# Patient Record
Sex: Female | Born: 1970 | Race: Asian | Hispanic: No | Marital: Married | Smoking: Never smoker
Health system: Southern US, Community
[De-identification: ages and names within clinical notes are randomized; demographics above are authoritative.]

## PROBLEM LIST (undated history)

## (undated) DIAGNOSIS — R0789 Other chest pain: Secondary | ICD-10-CM

## (undated) DIAGNOSIS — J45909 Unspecified asthma, uncomplicated: Secondary | ICD-10-CM

## (undated) DIAGNOSIS — Z9289 Personal history of other medical treatment: Secondary | ICD-10-CM

## (undated) HISTORY — PX: DILATION AND EVACUATION: SHX1459

---

## 1998-10-22 ENCOUNTER — Other Ambulatory Visit: Admission: RE | Admit: 1998-10-22 | Discharge: 1998-10-22 | Payer: Self-pay | Admitting: Obstetrics and Gynecology

## 1999-01-14 ENCOUNTER — Ambulatory Visit (HOSPITAL_COMMUNITY): Admission: RE | Admit: 1999-01-14 | Discharge: 1999-01-14 | Payer: Self-pay | Admitting: Obstetrics and Gynecology

## 1999-01-14 ENCOUNTER — Encounter: Payer: Self-pay | Admitting: Obstetrics and Gynecology

## 2000-03-11 ENCOUNTER — Other Ambulatory Visit: Admission: RE | Admit: 2000-03-11 | Discharge: 2000-03-11 | Payer: Self-pay | Admitting: Obstetrics and Gynecology

## 2000-06-08 ENCOUNTER — Ambulatory Visit (HOSPITAL_COMMUNITY): Admission: RE | Admit: 2000-06-08 | Discharge: 2000-06-08 | Payer: Self-pay | Admitting: Gynecology

## 2000-06-08 ENCOUNTER — Encounter: Payer: Self-pay | Admitting: Gynecology

## 2001-04-13 ENCOUNTER — Other Ambulatory Visit: Admission: RE | Admit: 2001-04-13 | Discharge: 2001-04-13 | Payer: Self-pay | Admitting: Obstetrics and Gynecology

## 2002-07-10 ENCOUNTER — Other Ambulatory Visit: Admission: RE | Admit: 2002-07-10 | Discharge: 2002-07-10 | Payer: Self-pay | Admitting: Obstetrics and Gynecology

## 2002-12-15 ENCOUNTER — Ambulatory Visit (HOSPITAL_COMMUNITY): Admission: RE | Admit: 2002-12-15 | Discharge: 2002-12-15 | Payer: Self-pay | Admitting: Obstetrics and Gynecology

## 2002-12-15 ENCOUNTER — Encounter (INDEPENDENT_AMBULATORY_CARE_PROVIDER_SITE_OTHER): Payer: Self-pay

## 2003-07-03 ENCOUNTER — Ambulatory Visit (HOSPITAL_COMMUNITY): Admission: RE | Admit: 2003-07-03 | Discharge: 2003-07-03 | Payer: Self-pay | Admitting: Obstetrics and Gynecology

## 2003-12-18 ENCOUNTER — Inpatient Hospital Stay (HOSPITAL_COMMUNITY): Admission: RE | Admit: 2003-12-18 | Discharge: 2003-12-22 | Payer: Self-pay | Admitting: Obstetrics and Gynecology

## 2004-01-22 ENCOUNTER — Other Ambulatory Visit: Admission: RE | Admit: 2004-01-22 | Discharge: 2004-01-22 | Payer: Self-pay | Admitting: Obstetrics and Gynecology

## 2004-12-02 ENCOUNTER — Other Ambulatory Visit: Admission: RE | Admit: 2004-12-02 | Discharge: 2004-12-02 | Payer: Self-pay | Admitting: Obstetrics and Gynecology

## 2005-03-25 ENCOUNTER — Ambulatory Visit (HOSPITAL_COMMUNITY): Admission: RE | Admit: 2005-03-25 | Discharge: 2005-03-25 | Payer: Self-pay | Admitting: Obstetrics and Gynecology

## 2006-07-26 ENCOUNTER — Emergency Department (HOSPITAL_COMMUNITY): Admission: EM | Admit: 2006-07-26 | Discharge: 2006-07-26 | Payer: Self-pay | Admitting: Emergency Medicine

## 2006-09-17 ENCOUNTER — Inpatient Hospital Stay (HOSPITAL_COMMUNITY): Admission: RE | Admit: 2006-09-17 | Discharge: 2006-09-20 | Payer: Self-pay | Admitting: Obstetrics and Gynecology

## 2009-05-22 ENCOUNTER — Encounter: Admission: RE | Admit: 2009-05-22 | Discharge: 2009-05-22 | Payer: Self-pay

## 2011-01-09 NOTE — Op Note (Signed)
[name], BATDORF                  ACCOUNT NO.:  [id number]   MEDICAL RECORD NO.:  [id number]          PATIENT TYPE:  INP   LOCATION:  9170                          FACILITY:  [facility]   PHYSICIAN:  Malva Limes, M.D.    DATE OF BIRTH:  [birth year]   DATE OF PROCEDURE:  09/17/2006  DATE OF DISCHARGE:                               OPERATIVE REPORT   PREOPERATIVE DIAGNOSES:  1. Intrauterine pregnancy at term.  2. History of prior cesarean section.  3. Patient desires attempt at cesarean section.  4. Compound presentation.   POSTOPERATIVE DIAGNOSES:  1. Intrauterine pregnancy at term.  2. History of prior cesarean section.  3. Patient desires attempt at cesarean section.  4. Compound presentation.   PROCEDURE:  Repeat low transverse cesarean section.   SURGEON:  Malva Limes, M.D.   ANESTHESIA:  Epidural.   ANTIBIOTICS:  Ancef 1 gram.   DRAINS:  Foley to bedside drainage.   ESTIMATED BLOOD LOSS:  900 cc.   COMPLICATIONS:  None.   SPECIMENS:  None.   FINDINGS:  The patient delivered one live viable female infant, with  Apgars of 9 at one minute and 10 at 5 minutes.   PROCEDURE:  The patient was taken to the operating room, where she was  placed in the dorsal supine position with a left lateral tilt.  Once an  adequate level was reached, the patient was prepped with Betadine and  draped in the usual fashion for this procedure.  The patient had a  Pfannenstiel incision made through the previous scar; this was carried  down through fascia.  The fascia was entered in the midline and extended  laterally with the Mayo scissors.  The rectus muscles were then  dissected from the fascia with the Bovie.  The parietoperitoneum was  entered sharply and taken __________ .  A bladder flap was taken down  sharply.  A low transverse uterine incision was made in the midline  extended laterally with blunt dissection.  Amniotic fluid was noted be  clear.  The infant was delivered in the  vertex presentation.  Once we  delivered the head, the oropharynx was bulb suctioned.  The remaining  infant was then delivered; the cord doubly clamped and cut, and the  infant handed to the awaiting NICU team.  Cord blood was then obtained.  The placenta was manually removed.  The uterus was closed in a single  layer of 0 Monocryl in a running locking fashion.  The bladder flap was  closed using 2-0 Monocryl in a running fashion.  The parietoperitoneum  and rectus muscles were reapproximated in the midline using 2-0 Monocryl  in a running fashion.  The fascia was closed using 0 Monocryl suture in  a running fashion.  Subcuticular tissue was made hemostatic with the  Bovie.  Stainless steel clips were used to close the skin.  The patient  tolerated the procedure well.  She was taken to the recovery room in  stable condition.  Instrument and lap counts were correct x2.  It should  be noted that the patient  had extensive scar tissue throughout all  layers, making it a difficult cesarean section.           ______________________________  Malva Limes, M.D.     MA/MEDQ  D:  09/17/2006  T:  09/17/2006  Job:  [id number]

## 2011-01-09 NOTE — Discharge Summary (Signed)
[name], NEASE                              ACCOUNT NO.:  [id number]   MEDICAL RECORD NO.:  [id number]                   PATIENT TYPE:  INP   LOCATION:  9120                                 [facility]ILITY[facility]  [facility]   PHYSICIAN:  Miguel Aschoff, M.D.                    DATE OF BIRTH:  04/29/1971   DATE OF ADMISSION:  12/18/2003  DATE OF DISCHARGE:  12/22/2003                                 DISCHARGE SUMMARY   FINAL DIAGNOSES:  1. Intrauterine pregnancy at term.  2. Breech presentation and the patient declined attempt at external version.   PROCEDURE:  Primary low transverse cesarean section.   SURGEON:  Dr. Malva Limes.   ASSISTANT:  Dr. Annamaria Helling.   COMPLICATIONS:  None.   This 40 year old G3 P0-0-2-0 presents at term for a primary cesarean section  secondary to breech presentation.  The patient's antepartum course has been  complicated by history of infertility.  She also had a history of a  myomectomy, but otherwise antepartum course was uncomplicated.  The patient  had a negative group B strep culture performed in the office.  She was  admitted at this time on December 18, 2003.  She was taken to the operating  room by Dr. Malva Limes where a primary low transverse cesarean section  was performed with the delivery of a 7-pound 12-ounce female infant with  Apgars of 9 and 9.  Delivery went without complications.  The patient's  postoperative course was benign without significant fevers.  The patient was  exhausted and therefore was kept in the hospital until postoperative day #4.  She was sent home on a regular diet, told to decrease activities, told to  continue prenatal vitamins, was given Tylox one to two q.4h. as needed for  pain, was told to follow up in the office in 4-6 weeks.   LABORATORY DATA ON DISCHARGE:  The patient had a hemoglobin of 10.2, white  blood cell count of 8.9.     Leilani Able, P.A.-C.                Miguel Aschoff, M.D.    MB/MEDQ  D:  01/07/2004  T:   01/07/2004  Job:  [id number]

## 2011-01-09 NOTE — Op Note (Signed)
NAMESTEFANIE, HODGENS                              ACCOUNT NO.:  [id number]   MEDICAL RECORD NO.:  [id number]                   PATIENT TYPE:  INP   LOCATION:  9120                                 [facility]   PHYSICIAN:  Malva Limes, M.D.                 DATE OF BIRTH:  [birth year]   DATE OF PROCEDURE:  12/18/2003  DATE OF DISCHARGE:                                 OPERATIVE REPORT   PREOPERATIVE DIAGNOSES:  1. Intrauterine pregnancy at term.  2. Breech presentation.  3. Patient declined attempt at external version.   POSTOPERATIVE DIAGNOSES:  1. Intrauterine pregnancy at term.  2. Breech presentation.  3. Patient declined attempt at external version.   PROCEDURE:  Primary low transverse cesarean section.   SURGEON:  Malva Limes, M.D.   ASSISTANT:  Gerrit Friends. Aldona Bar, M.D.   ANESTHESIA:  Spinal.   ANTIBIOTICS:  Ancef 1 g.   ESTIMATED BLOOD LOSS:  900 mL.   COMPLICATIONS:  None.   SPECIMENS:  None.   DRAINS:  Foley to bedside drainage.   FINDINGS:  The patient delivered one live viable female infant in the frank  breech presentation.  The infant appeared to be normal.  Placenta appeared  to be normal.  Fallopian tubes, ovaries and uterus all normal.  There was no  evidence of a uterine septum.   DESCRIPTION OF PROCEDURE:  The patient was taken to the operating room where  a spinal anesthetic was placed without difficulty.  She was placed in the  dorsal supine position with left lateral tilt.  The patient was prepped with  Hibiclens and a Foley catheter was placed.  She was draped in the usual  fashion for this procedure.  A Pfannenstiel incision was made.  This was  carried down to the fascia.  Fascia was entered in the midline and extended  laterally.  The rectus muscles were dissected from the fascia with Bovie.  Rectus muscles were divided in the midline and taken superiorly and  inferiorly.  The parietal peritoneum was entered sharply.  The bladder blade  was  taken down sharply.  A low transverse uterine incision was made in the  midline, extended laterally with blunt dissection.  Amniotic sac was entered  and the fluid noted to be clear.  The infant was delivered in the breech  presentation.  After the head was delivered, the oropharynx and nostrils  were bulb suctioned.  The cord was doubly clamped and cut and the infant  handed to the NICU team.  Cord blood was then obtained.  Placenta was  manually removed.  Uterine incision was closed in a single layer of 0  chromic in a running locking fashion.  The bladder flap was closed using 0  chromic suture in a running fashion.  Hemostasis was checked and found to be  adequate.  The parietal peritoneum and rectus muscles were  reapproximated at  the midline using 0 Monocryl in a running fashion.  Fascia was closed using  0 Monocryl suture in a running fashion.  The subcuticular tissue was made  hemostatic with a Bovie.  Stainless steel clips were used to close the skin.  The patient tolerated the procedure well and she was taken to the recovery  room in stable condition.  Instrument and lap counts correct x2.                                               Malva Limes, M.D.    MA/MEDQ  D:  12/18/2003  T:  12/18/2003  Job:  [id number]

## 2011-01-09 NOTE — Op Note (Signed)
[name], [name]                              ACCOUNT NO.:  [id number]   MEDICAL RECORD NO.:  [id number]                   PATIENT TYPE:  AMB   LOCATION:  SDC                                  [facility]   PHYSICIAN:  Dineen Kid. Rana Snare, M.D.                 DATE OF BIRTH:  1971-04-23   DATE OF PROCEDURE:  12/15/2002  DATE OF DISCHARGE:                                 OPERATIVE REPORT   PREOPERATIVE DIAGNOSIS:  Intrauterine pregnancy at 15-1/2 weeks with severe  fetal nonimmune hydrops.   POSTOPERATIVE DIAGNOSIS:  Intrauterine pregnancy at 15-1/2 weeks with severe  fetal nonimmune hydrops.   PROCEDURE:  Dilatation and evacuation by ultrasound guidance.   SURGEON:  Dineen Kid. Rana Snare, M.D.   ANESTHESIA:  General endotracheal.   INDICATIONS:  The patient is a 40 year old G2, P1 at 15-1/2 weeks with  severe fetal nonimmune hydrops confirmed in our office, also with maternal  fetal medicine consultation and level 2 ultrasound performed in two  different facilities.  Because of the very poor prognosis patient desires  D&E.  Risks and benefits discussed at length.  Informed consent was obtained  . Blood type was B+.  See history and physical for further details.   DESCRIPTION OF PROCEDURE:  After adequate analgesia the patient placed in  dorsal lithotomy position.  She is sterilely prepped and draped.  The  bladder was sterilely drained.  Graves speculum was placed.  The Dilapan was  easily removed.  Tenaculum was placed in the anterior lip of the cervix.  The cervix was easily dilated up to a number 51 Pratt dilator.  A 14 mm  suction curette was inserted and amniotic fluid and placental fragments were  easily removed.  Ultrasound was placed on the abdomen and fetal fragments  were grasped with graspers until no more fetal parts could be palpated or  visualized on the ultrasound.  This was followed by suction curettage  revealing a gritty surface throughout the endometrial cavity and  also  confirmed by ultrasound.  No residual parts seen by ultrasound.  At this  point the curette was removed.  The patient received Methergine 0.2 mg IM  with good uterine response.  Tenaculum was removed from the cervix and noted  to be hemostatic.  A curette was easily inserted and used to sound across  the uterine cavity, again revealing a gritty surface throughout the  endometrium and no residual parts.  At this time the curette was removed.  Cervix noted to be hemostatic.  Speculum was then removed.  Pelvic  examination after the procedure revealed approximately about 10 week sized  uterus with minimal bleeding.  The patient was transferred to the recovery  room in stable condition.  Sponge and instrument count was normal x3.  The  estimated blood loss during the procedure was 100 mL.   DISPOSITION:  The patient  to be discharged home.  Will follow up in the  office in two to three weeks.  Sent home with a prescription for Methergine  0.2 mg to take q.8h. for three days and doxycycline 100 mg p.o. b.i.d. x7  days.  Told to return for increased bleeding, cramping, or fever.                                               Dineen Kid Rana Snare, M.D.    DCL/MEDQ  D:  12/15/2002  T:  12/15/2002  Job:  [id number]

## 2011-01-09 NOTE — H&P (Signed)
   [name], [name]                              ACCOUNT NO.:  [id number]   MEDICAL RECORD NO.:  [id number]                   PATIENT TYPE:  AMB   LOCATION:  SDC                                  [facility]   PHYSICIAN:  Dineen Kid. Rana Snare, M.D.                 DATE OF BIRTH:  [birth year]   DATE OF ADMISSION:  12/15/2002  DATE OF DISCHARGE:                                HISTORY & PHYSICAL   HISTORY OF PRESENT ILLNESS:  The patient is a 40 year old G2, P1 at 15-1/[redacted]  weeks gestational age, who presents for dilation and evacuation. Her  pregnancy has been complicated by severe fetal nonimmune hydrops, first  discovered at 13 weeks by ultrasound in my office. She had followup level 2  ultrasound by Dr. Sherrie George in town. Chromosomal and viral studies were  performed. She also had a second maternal fetal medicine consultation  in  [place]. Because of the dismal prognosis the patient and her husband  adamantly desire termination of pregnancy and present for that today. Her  blood type is B positive.   PAST MEDICAL/SURGICAL HISTORY:  1. Myomectomy.  2. She has had a dilation and evacuation in the past for missed abortion.   MEDICATIONS:  Prenatal vitamins.   ALLERGIES:  She is not allergic to any medications.   PHYSICAL EXAMINATION:  The uterus is 15 week sized. The cervix is closed,  thick and high, however, 1 __________ was placed per Dr. Jennette Kettle yesterday.   IMPRESSION AND PLAN:  Intrauterine pregnancy at 15-1/2 weeks with severe  fetal nonimmune hydrops with a very poor prognosis. The patient and her  husband adamantly desire termination of pregnancy. The risks and benefits  were discussed at length which include but were not limited to the risks of  infection, bleeding, damage to the uterus, tubes, ovaries, bowel or bladder.  They do give informed consent.                                               Dineen Kid Rana Snare, M.D.    DCL/MEDQ  D:  12/15/2002  T:  12/15/2002  Job:   [id number]

## 2011-01-09 NOTE — Discharge Summary (Signed)
NAMELASASHA, BROPHY                  ACCOUNT NO.:  [id number]   MEDICAL RECORD NO.:  [id number]          PATIENT TYPE:  INP   LOCATION:  9106                          [facility]   PHYSICIAN:  Ilda Mori, M.D.   DATE OF BIRTH:  [birth year]   DATE OF ADMISSION:  09/17/2006  DATE OF DISCHARGE:  09/20/2006                               DISCHARGE SUMMARY   FINAL DIAGNOSES:  1. Intrauterine pregnancy at term.  2. History of prior cesarean section.  3. The patient desires vaginal birth after cesarean section.  4. Induction of labor secondary to severe low back pain.  5. Compound presentation.   PROCEDURE:  Repeat low transverse cesarean section.   SURGEON:  Dr. Malva Limes.   COMPLICATIONS:  None.   This 40 year old G4 P1-0-2-1 presents at 26 weeks' gestation for  induction secondary to low back pain.  The patient has had a history of  a prior cesarean section and desires to try a vaginal birth after  cesarean with this pregnancy.  She had a negative group B strep culture  obtained in our office.  She does have a latex allergy and has had a  history of a myomectomy and has been having this low back pain.  She is  admitted at this time for an induction.  During her labor course, there  was a hand noted next to the head.  She was about 3-4 cm dilated at this  point.  Attempts by Dr. Malva Limes was to reduce this but was  successful.  Now the arm was in the vagina and a decision was made to  proceed with a cesarean section.  The patient was taken to the operating  room on September 17, 2006, where a repeat low transverse cesarean section  was performed with the delivery of a 7 pound 10 ounce female infant with  Apgars of 9 and 10.  The delivery went without complications.   The patient's postoperative course was benign without any significant  fevers.  She was felt ready for discharge on postoperative day #3, was  sent home on a regular diet, told to decrease activities, told  to  continue her prenatal vitamins, was given Tylox #25 one to two every 6  hours as needed for pain, told she could use over-the-counter ibuprofen  up to 600 mg every 6 hours as needed for pain, was to follow up in our  office in 6 weeks, of course to call with any increased bleeding, pain  or problems.   LABS ON DISCHARGE:  The patient had a hemoglobin of 10.4, white blood  cell count of 9.9 and platelets of 198,000.      Leilani Able, P.A.-C.      Ilda Mori, M.D.  Electronically Signed    MB/MEDQ  D:  10/11/2006  T:  10/11/2006  Job:  [id number]

## 2011-07-14 ENCOUNTER — Other Ambulatory Visit: Payer: Self-pay | Admitting: Obstetrics and Gynecology

## 2012-06-02 ENCOUNTER — Ambulatory Visit: Payer: 59 | Admitting: Emergency Medicine

## 2012-06-02 VITALS — BP 112/68 | HR 72 | Temp 98.0°F | Resp 16 | Ht 65.0 in | Wt 144.0 lb

## 2012-06-02 DIAGNOSIS — L0390 Cellulitis, unspecified: Secondary | ICD-10-CM

## 2012-06-02 DIAGNOSIS — L309 Dermatitis, unspecified: Secondary | ICD-10-CM

## 2012-06-02 DIAGNOSIS — L241 Irritant contact dermatitis due to oils and greases: Secondary | ICD-10-CM

## 2012-06-02 MED ORDER — MUPIROCIN CALCIUM 2 % EX CREA: TOPICAL_CREAM | Freq: Three times a day (TID) | CUTANEOUS | Status: DC

## 2012-06-02 MED ORDER — TRIAMCINOLONE 0.1 % CREAM:EUCERIN CREAM 1:1: TOPICAL_CREAM | CUTANEOUS | Status: DC

## 2012-06-02 NOTE — Progress Notes (Signed)
  Subjective:    Patient ID: [name], female    DOB: Aug 21, 1971, 41 y.o.   MRN: [id number]  HPI patient enters with a long history of eczema. She waxes both of her legs. She has developed an irritated area on the front of her left leg. She feels this area may be a little swollen . She has a long history of allergies. She has a history of dry skin to he uses [place] and dove as has her soaps. She has been using Neosporin and hydrocortisone cream.  Review of Systems     Objective:   Physical Exam there is an eczematoid rash over the shins of both lower extremities. There is a linear area of excoriation the left anterior shin .        Assessment & Plan:  The patient suffers from chronic dry skin and eczema. We'll treat with a combination of moisturizer with triamcinolone.

## 2012-06-02 NOTE — Patient Instructions (Addendum)

## 2012-08-24 DIAGNOSIS — Z9289 Personal history of other medical treatment: Secondary | ICD-10-CM

## 2012-08-24 HISTORY — DX: Personal history of other medical treatment: Z92.89

## 2013-03-28 ENCOUNTER — Other Ambulatory Visit: Payer: Self-pay

## 2013-03-28 DIAGNOSIS — Z1231 Encounter for screening mammogram for malignant neoplasm of breast: Secondary | ICD-10-CM

## 2013-04-17 ENCOUNTER — Ambulatory Visit
Admission: RE | Admit: 2013-04-17 | Discharge: 2013-04-17 | Disposition: A | Discharge status: Home / Self Care | Payer: BC Managed Care – PPO | Source: Ambulatory Visit

## 2013-04-17 DIAGNOSIS — Z1231 Encounter for screening mammogram for malignant neoplasm of breast: Secondary | ICD-10-CM

## 2014-05-07 ENCOUNTER — Other Ambulatory Visit: Payer: Self-pay

## 2014-05-07 DIAGNOSIS — Z1231 Encounter for screening mammogram for malignant neoplasm of breast: Secondary | ICD-10-CM

## 2014-05-15 ENCOUNTER — Other Ambulatory Visit: Payer: Self-pay | Admitting: Obstetrics and Gynecology

## 2014-05-16 LAB — CYTOLOGY - PAP

## 2014-05-18 ENCOUNTER — Ambulatory Visit: Payer: BC Managed Care – PPO

## 2014-05-29 ENCOUNTER — Encounter (INDEPENDENT_AMBULATORY_CARE_PROVIDER_SITE_OTHER): Payer: Self-pay

## 2014-05-29 ENCOUNTER — Ambulatory Visit
Admission: RE | Admit: 2014-05-29 | Discharge: 2014-05-29 | Disposition: A | Discharge status: Home / Self Care | Payer: BC Managed Care – PPO | Source: Ambulatory Visit

## 2014-05-29 DIAGNOSIS — Z1231 Encounter for screening mammogram for malignant neoplasm of breast: Secondary | ICD-10-CM

## 2015-06-27 ENCOUNTER — Other Ambulatory Visit: Payer: Self-pay | Admitting: Obstetrics and Gynecology

## 2015-06-28 LAB — CYTOLOGY - PAP

## 2015-07-11 ENCOUNTER — Other Ambulatory Visit: Payer: Self-pay | Admitting: Internal Medicine

## 2015-07-11 ENCOUNTER — Ambulatory Visit
Admission: RE | Admit: 2015-07-11 | Discharge: 2015-07-11 | Disposition: A | Discharge status: Home / Self Care | Payer: BLUE CROSS/BLUE SHIELD | Source: Ambulatory Visit | Attending: Internal Medicine | Admitting: Internal Medicine

## 2015-07-11 DIAGNOSIS — R05 Cough: Secondary | ICD-10-CM

## 2015-07-11 DIAGNOSIS — R059 Cough, unspecified: Secondary | ICD-10-CM

## 2015-10-29 ENCOUNTER — Other Ambulatory Visit: Payer: Self-pay | Admitting: Obstetrics and Gynecology

## 2015-10-29 DIAGNOSIS — N644 Mastodynia: Secondary | ICD-10-CM

## 2015-11-04 ENCOUNTER — Ambulatory Visit
Admission: RE | Admit: 2015-11-04 | Discharge: 2015-11-04 | Disposition: A | Discharge status: Home / Self Care | Payer: BLUE CROSS/BLUE SHIELD | Source: Ambulatory Visit | Attending: Obstetrics and Gynecology | Admitting: Obstetrics and Gynecology

## 2015-11-04 DIAGNOSIS — N644 Mastodynia: Secondary | ICD-10-CM

## 2016-07-08 ENCOUNTER — Other Ambulatory Visit: Payer: Self-pay | Admitting: Obstetrics and Gynecology

## 2016-07-09 LAB — CYTOLOGY - PAP

## 2018-04-12 ENCOUNTER — Other Ambulatory Visit: Payer: Self-pay | Admitting: Obstetrics and Gynecology

## 2018-04-29 NOTE — Patient Instructions (Addendum)
Your procedure is scheduled on: Thursday May 12, 2018 at 12:00 pm  Enter through the Main Entrance of [facility] at: 10:30 am  Pick up the phone at the desk and dial [phone].  Call this number if you have problems the morning of surgery: [phone].  Remember: Do NOT eat food or drink any liquids: after Midnight on Wednesday September 18  Take these medicines the morning of surgery with a SIP OF WATER: xyzal if needed, albuterol inhaler if needed  BRING ALBUTEROL INHALER WITH YOU DAY OF SURGERY  STOP ALL VITAMINS, SUPPLEMENTS, HERBAL MEDICATIONS NOW  DO NOT SMOKE DAY OF SURGERY  BRUSH YOUR TEETH DAY OF SURGERY  Do NOT wear jewelry (body piercing), metal hair clips/bobby pins, make-up, or nail polish. Do NOT wear lotions, powders, or perfumes.  You may wear deoderant. Do NOT shave for 48 hours prior to surgery. Do NOT bring valuables to the hospital. Contacts, dentures, or bridgework may not be worn into surgery. Leave suitcase in car.  After surgery it may be brought to your room.    For patients admitted to the hospital, checkout time is 11:00 AM the day of discharge.

## 2018-05-02 ENCOUNTER — Encounter (HOSPITAL_COMMUNITY)
Admission: RE | Admit: 2018-05-02 | Discharge: 2018-05-02 | Disposition: A | Discharge status: Home / Self Care | Payer: BLUE CROSS/BLUE SHIELD | Source: Ambulatory Visit | Attending: Obstetrics and Gynecology | Admitting: Obstetrics and Gynecology

## 2018-05-02 ENCOUNTER — Encounter (HOSPITAL_COMMUNITY): Payer: Self-pay

## 2018-05-02 ENCOUNTER — Other Ambulatory Visit: Payer: Self-pay

## 2018-05-02 DIAGNOSIS — Z01812 Encounter for preprocedural laboratory examination: Secondary | ICD-10-CM | POA: Diagnosis not present

## 2018-05-02 HISTORY — DX: Unspecified asthma, uncomplicated: J45.909

## 2018-05-02 HISTORY — DX: Personal history of other medical treatment: Z92.89

## 2018-05-02 HISTORY — DX: Other chest pain: R07.89

## 2018-05-02 LAB — CBC
HCT: 37 % (ref 36.0–46.0)
Hemoglobin: 11.7 g/dL — ABNORMAL LOW (ref 12.0–15.0)
MCH: 24.5 pg — ABNORMAL LOW (ref 26.0–34.0)
MCHC: 31.6 g/dL (ref 30.0–36.0)
MCV: 77.4 fL — ABNORMAL LOW (ref 78.0–100.0)
Platelets: 245 10*3/uL (ref 150–400)
RBC: 4.78 MIL/uL (ref 3.87–5.11)
RDW: 15.4 % (ref 11.5–15.5)
WBC: 8.6 10*3/uL (ref 4.0–10.5)

## 2018-05-12 ENCOUNTER — Inpatient Hospital Stay (HOSPITAL_COMMUNITY)
Admission: RE | Admit: 2018-05-12 | Payer: BLUE CROSS/BLUE SHIELD | Source: Ambulatory Visit | Admitting: Obstetrics and Gynecology

## 2018-05-12 SURGERY — HYSTERECTOMY, ABDOMINAL
Anesthesia: Choice

## 2018-05-24 ENCOUNTER — Other Ambulatory Visit: Payer: Self-pay | Admitting: Obstetrics and Gynecology

## 2018-05-26 ENCOUNTER — Encounter (HOSPITAL_COMMUNITY): Payer: Self-pay | Admitting: Certified Registered Nurse Anesthetist

## 2018-05-26 ENCOUNTER — Inpatient Hospital Stay (HOSPITAL_COMMUNITY)
Admission: RE | Admit: 2018-05-26 | Discharge: 2018-05-28 | DRG: 743 | Disposition: A | Discharge status: Home / Self Care | Payer: BLUE CROSS/BLUE SHIELD | Attending: Obstetrics and Gynecology | Admitting: Obstetrics and Gynecology

## 2018-05-26 ENCOUNTER — Encounter (HOSPITAL_COMMUNITY)
Admission: RE | Disposition: A | Discharge status: Home / Self Care | Payer: Self-pay | Source: Home / Self Care | Attending: Obstetrics and Gynecology

## 2018-05-26 ENCOUNTER — Inpatient Hospital Stay (HOSPITAL_COMMUNITY): Payer: BLUE CROSS/BLUE SHIELD | Admitting: Certified Registered Nurse Anesthetist

## 2018-05-26 ENCOUNTER — Other Ambulatory Visit: Payer: Self-pay

## 2018-05-26 DIAGNOSIS — N920 Excessive and frequent menstruation with regular cycle: Secondary | ICD-10-CM | POA: Diagnosis present

## 2018-05-26 DIAGNOSIS — K660 Peritoneal adhesions (postprocedural) (postinfection): Secondary | ICD-10-CM | POA: Diagnosis present

## 2018-05-26 DIAGNOSIS — J45909 Unspecified asthma, uncomplicated: Secondary | ICD-10-CM | POA: Diagnosis present

## 2018-05-26 DIAGNOSIS — D259 Leiomyoma of uterus, unspecified: Principal | ICD-10-CM | POA: Diagnosis present

## 2018-05-26 DIAGNOSIS — D649 Anemia, unspecified: Secondary | ICD-10-CM | POA: Diagnosis present

## 2018-05-26 DIAGNOSIS — Z91040 Latex allergy status: Secondary | ICD-10-CM

## 2018-05-26 DIAGNOSIS — R102 Pelvic and perineal pain: Secondary | ICD-10-CM | POA: Diagnosis present

## 2018-05-26 HISTORY — PX: ABDOMINAL HYSTERECTOMY: SHX81

## 2018-05-26 HISTORY — PX: BILATERAL SALPINGECTOMY: SHX5743

## 2018-05-26 SURGERY — HYSTERECTOMY, ABDOMINAL
Anesthesia: General | Site: Abdomen

## 2018-05-26 MED ORDER — KETOROLAC TROMETHAMINE 30 MG/ML IJ SOLN
30.0000 mg | Freq: Once | INTRAMUSCULAR | Status: AC
  Administered 2018-05-26: 30 mg via INTRAVENOUS

## 2018-05-26 MED ORDER — OXYCODONE HCL 5 MG PO TABS: 5.0000 mg | ORAL_TABLET | Freq: Once | ORAL | Status: DC | PRN

## 2018-05-26 MED ORDER — SUGAMMADEX SODIUM 200 MG/2ML IV SOLN
INTRAVENOUS | Status: AC
  Filled 2018-05-26: qty 2

## 2018-05-26 MED ORDER — FENTANYL CITRATE (PF) 100 MCG/2ML IJ SOLN
INTRAMUSCULAR | Status: AC
  Filled 2018-05-26: qty 2

## 2018-05-26 MED ORDER — FENTANYL CITRATE (PF) 100 MCG/2ML IJ SOLN
INTRAMUSCULAR | Status: DC | PRN
  Administered 2018-05-26: 50 ug via INTRAVENOUS
  Administered 2018-05-26 (×2): 100 ug via INTRAVENOUS

## 2018-05-26 MED ORDER — DEXAMETHASONE SODIUM PHOSPHATE 10 MG/ML IJ SOLN
INTRAMUSCULAR | Status: DC | PRN
  Administered 2018-05-26: 4 mg via INTRAVENOUS

## 2018-05-26 MED ORDER — PROPOFOL 10 MG/ML IV BOLUS
INTRAVENOUS | Status: AC
  Filled 2018-05-26: qty 20

## 2018-05-26 MED ORDER — ACETAMINOPHEN 10 MG/ML IV SOLN
1000.0000 mg | Freq: Once | INTRAVENOUS | Status: AC
  Administered 2018-05-26: 1000 mg via INTRAVENOUS

## 2018-05-26 MED ORDER — FENTANYL CITRATE (PF) 100 MCG/2ML IJ SOLN
INTRAMUSCULAR | Status: AC
  Administered 2018-05-26: 50 ug via INTRAVENOUS
  Filled 2018-05-26: qty 2

## 2018-05-26 MED ORDER — ACETAMINOPHEN 160 MG/5ML PO SOLN: 325.0000 mg | ORAL | Status: DC | PRN

## 2018-05-26 MED ORDER — LORATADINE 10 MG PO TABS
10.0000 mg | ORAL_TABLET | Freq: Every day | ORAL | Status: DC | PRN
  Filled 2018-05-26: qty 1

## 2018-05-26 MED ORDER — IBUPROFEN 600 MG PO TABS
600.0000 mg | ORAL_TABLET | Freq: Four times a day (QID) | ORAL | Status: DC | PRN
  Filled 2018-05-26: qty 1

## 2018-05-26 MED ORDER — HYDROMORPHONE HCL 1 MG/ML IJ SOLN
0.2000 mg | INTRAMUSCULAR | Status: DC | PRN
  Administered 2018-05-26 (×2): 0.6 mg via INTRAVENOUS
  Administered 2018-05-26: 0.2 mg via INTRAVENOUS
  Filled 2018-05-26 (×3): qty 1

## 2018-05-26 MED ORDER — ONDANSETRON HCL 4 MG/2ML IJ SOLN
INTRAMUSCULAR | Status: AC
  Filled 2018-05-26: qty 2

## 2018-05-26 MED ORDER — SCOPOLAMINE 1 MG/3DAYS TD PT72
MEDICATED_PATCH | TRANSDERMAL | Status: AC
  Administered 2018-05-26: 1.5 mg via TRANSDERMAL
  Filled 2018-05-26: qty 1

## 2018-05-26 MED ORDER — ONDANSETRON HCL 4 MG/2ML IJ SOLN
INTRAMUSCULAR | Status: DC | PRN
  Administered 2018-05-26: 4 mg via INTRAVENOUS

## 2018-05-26 MED ORDER — MIDAZOLAM HCL 2 MG/2ML IJ SOLN
INTRAMUSCULAR | Status: DC | PRN
  Administered 2018-05-26: 2 mg via INTRAVENOUS

## 2018-05-26 MED ORDER — LIDOCAINE HCL (CARDIAC) PF 100 MG/5ML IV SOSY
PREFILLED_SYRINGE | INTRAVENOUS | Status: DC | PRN
  Administered 2018-05-26: 50 mg via INTRAVENOUS

## 2018-05-26 MED ORDER — LACTATED RINGERS IV SOLN
INTRAVENOUS | Status: DC
  Administered 2018-05-26 (×3): via INTRAVENOUS

## 2018-05-26 MED ORDER — DEXAMETHASONE SODIUM PHOSPHATE 4 MG/ML IJ SOLN
INTRAMUSCULAR | Status: AC
  Filled 2018-05-26: qty 1

## 2018-05-26 MED ORDER — KETOROLAC TROMETHAMINE 30 MG/ML IJ SOLN
INTRAMUSCULAR | Status: AC
  Filled 2018-05-26: qty 1

## 2018-05-26 MED ORDER — ACETAMINOPHEN 10 MG/ML IV SOLN
INTRAVENOUS | Status: AC
  Administered 2018-05-26: 1000 mg via INTRAVENOUS
  Filled 2018-05-26: qty 100

## 2018-05-26 MED ORDER — MIDAZOLAM HCL 2 MG/2ML IJ SOLN
INTRAMUSCULAR | Status: AC
  Filled 2018-05-26: qty 2

## 2018-05-26 MED ORDER — FENTANYL CITRATE (PF) 250 MCG/5ML IJ SOLN
INTRAMUSCULAR | Status: AC
  Filled 2018-05-26: qty 5

## 2018-05-26 MED ORDER — DEXTROSE IN LACTATED RINGERS 5 % IV SOLN
INTRAVENOUS | Status: DC
  Administered 2018-05-26 – 2018-05-27 (×2): via INTRAVENOUS

## 2018-05-26 MED ORDER — ONDANSETRON HCL 4 MG PO TABS: 4.0000 mg | ORAL_TABLET | Freq: Four times a day (QID) | ORAL | Status: DC | PRN

## 2018-05-26 MED ORDER — ACETAMINOPHEN 325 MG PO TABS: 325.0000 mg | ORAL_TABLET | ORAL | Status: DC | PRN

## 2018-05-26 MED ORDER — SUGAMMADEX SODIUM 200 MG/2ML IV SOLN
INTRAVENOUS | Status: DC | PRN
  Administered 2018-05-26: 150 mg via INTRAVENOUS

## 2018-05-26 MED ORDER — KETOROLAC TROMETHAMINE 30 MG/ML IJ SOLN
INTRAMUSCULAR | Status: AC
  Administered 2018-05-26: 30 mg via INTRAVENOUS
  Filled 2018-05-26: qty 1

## 2018-05-26 MED ORDER — ALBUTEROL SULFATE (2.5 MG/3ML) 0.083% IN NEBU: 3.0000 mL | INHALATION_SOLUTION | Freq: Four times a day (QID) | RESPIRATORY_TRACT | Status: DC | PRN

## 2018-05-26 MED ORDER — SCOPOLAMINE 1 MG/3DAYS TD PT72
1.0000 | MEDICATED_PATCH | Freq: Once | TRANSDERMAL | Status: DC
  Administered 2018-05-26: 1.5 mg via TRANSDERMAL

## 2018-05-26 MED ORDER — FENTANYL CITRATE (PF) 100 MCG/2ML IJ SOLN
25.0000 ug | INTRAMUSCULAR | Status: DC | PRN
  Administered 2018-05-26 (×3): 50 ug via INTRAVENOUS

## 2018-05-26 MED ORDER — ONDANSETRON HCL 4 MG/2ML IJ SOLN: 4.0000 mg | Freq: Four times a day (QID) | INTRAMUSCULAR | Status: DC | PRN

## 2018-05-26 MED ORDER — ROCURONIUM BROMIDE 100 MG/10ML IV SOLN
INTRAVENOUS | Status: DC | PRN
  Administered 2018-05-26: 50 mg via INTRAVENOUS
  Administered 2018-05-26 (×2): 10 mg via INTRAVENOUS

## 2018-05-26 MED ORDER — CEFAZOLIN SODIUM-DEXTROSE 2-3 GM-%(50ML) IV SOLR: INTRAVENOUS | Status: DC | PRN

## 2018-05-26 MED ORDER — OXYCODONE HCL 5 MG/5ML PO SOLN: 5.0000 mg | Freq: Once | ORAL | Status: DC | PRN

## 2018-05-26 MED ORDER — PROPOFOL 10 MG/ML IV BOLUS
INTRAVENOUS | Status: DC | PRN
  Administered 2018-05-26: 200 mg via INTRAVENOUS

## 2018-05-26 MED ORDER — CEFAZOLIN SODIUM-DEXTROSE 2-3 GM-%(50ML) IV SOLR
INTRAVENOUS | Status: DC | PRN
  Administered 2018-05-26: 2 g via INTRAVENOUS

## 2018-05-26 MED ORDER — SIMETHICONE 80 MG PO CHEW
80.0000 mg | CHEWABLE_TABLET | Freq: Four times a day (QID) | ORAL | Status: DC | PRN
  Administered 2018-05-26 – 2018-05-27 (×3): 80 mg via ORAL
  Filled 2018-05-26 (×3): qty 1

## 2018-05-26 MED ORDER — SENNOSIDES-DOCUSATE SODIUM 8.6-50 MG PO TABS: 1.0000 | ORAL_TABLET | Freq: Every evening | ORAL | Status: DC | PRN

## 2018-05-26 MED ORDER — DOCUSATE SODIUM 100 MG PO CAPS
100.0000 mg | ORAL_CAPSULE | Freq: Two times a day (BID) | ORAL | Status: DC
  Administered 2018-05-26 – 2018-05-28 (×4): 100 mg via ORAL
  Filled 2018-05-26 (×4): qty 1

## 2018-05-26 MED ORDER — CEFAZOLIN SODIUM-DEXTROSE 2-4 GM/100ML-% IV SOLN
INTRAVENOUS | Status: AC
  Filled 2018-05-26: qty 100

## 2018-05-26 MED ORDER — LEVOCETIRIZINE DIHYDROCHLORIDE 5 MG PO TABS: 5.0000 mg | ORAL_TABLET | Freq: Every day | ORAL | Status: DC | PRN

## 2018-05-26 MED ORDER — PANTOPRAZOLE SODIUM 40 MG PO TBEC
40.0000 mg | DELAYED_RELEASE_TABLET | Freq: Every day | ORAL | Status: DC
  Administered 2018-05-27 – 2018-05-28 (×2): 40 mg via ORAL
  Filled 2018-05-26 (×2): qty 1

## 2018-05-26 MED ORDER — MEPERIDINE HCL 25 MG/ML IJ SOLN: 6.2500 mg | INTRAMUSCULAR | Status: DC | PRN

## 2018-05-26 MED ORDER — HYDROMORPHONE HCL 2 MG PO TABS
2.0000 mg | ORAL_TABLET | ORAL | Status: DC | PRN
  Administered 2018-05-27 – 2018-05-28 (×6): 2 mg via ORAL
  Filled 2018-05-26 (×7): qty 1

## 2018-05-26 MED ORDER — LIDOCAINE HCL (PF) 1 % IJ SOLN
INTRAMUSCULAR | Status: AC
  Filled 2018-05-26: qty 5

## 2018-05-26 SURGICAL SUPPLY — 33 items
APL SKNCLS STERI-STRIP NONHPOA (GAUZE/BANDAGES/DRESSINGS) ×2
BENZOIN TINCTURE PRP APPL 2/3 (GAUZE/BANDAGES/DRESSINGS) ×2 IMPLANT
BLADE SURG 10 STRL SS (BLADE) ×2 IMPLANT
CANISTER SUCT 3000ML PPV (MISCELLANEOUS) ×4 IMPLANT
CLOSURE WOUND 1/2 X4 (GAUZE/BANDAGES/DRESSINGS) ×1
CONT PATH 16OZ SNAP LID 3702 (MISCELLANEOUS) ×4 IMPLANT
DECANTER SPIKE VIAL GLASS SM (MISCELLANEOUS) IMPLANT
DRAPE WARM FLUID 44X44 (DRAPE) IMPLANT
DRSG OPSITE POSTOP 4X10 (GAUZE/BANDAGES/DRESSINGS) ×4 IMPLANT
DURAPREP 26ML APPLICATOR (WOUND CARE) ×4 IMPLANT
ELECT BLADE 6 FLAT ULTRCLN (ELECTRODE) ×3 IMPLANT
GAUZE 4X4 16PLY RFD (DISPOSABLE) ×2 IMPLANT
GLOVE BIOGEL PI IND STRL 7.0 (GLOVE) ×4 IMPLANT
GLOVE BIOGEL PI INDICATOR 7.0 (GLOVE) ×4
GLOVE ECLIPSE 7.0 STRL STRAW (GLOVE) ×4 IMPLANT
GOWN STRL REUS W/TWL LRG LVL3 (GOWN DISPOSABLE) ×12 IMPLANT
PACK ABDOMINAL GYN (CUSTOM PROCEDURE TRAY) ×4 IMPLANT
PAD OB MATERNITY 4.3X12.25 (PERSONAL CARE ITEMS) ×4 IMPLANT
PROTECTOR NERVE ULNAR (MISCELLANEOUS) ×8 IMPLANT
SPONGE LAP 18X18 X RAY DECT (DISPOSABLE) ×8 IMPLANT
STAPLER VISISTAT 35W (STAPLE) ×4 IMPLANT
STRIP CLOSURE SKIN 1/2X4 (GAUZE/BANDAGES/DRESSINGS) ×1 IMPLANT
SUT MNCRL 0 MO-4 VIOLET 18 CR (SUTURE) ×6 IMPLANT
SUT MNCRL 0 VIOLET 6X18 (SUTURE) ×2 IMPLANT
SUT MNCRL AB 0 CT1 27 (SUTURE) ×8 IMPLANT
SUT MON AB 2-0 CT1 27 (SUTURE) ×4 IMPLANT
SUT MONOCRYL 0 6X18 (SUTURE) ×2
SUT MONOCRYL 0 MO 4 18  CR/8 (SUTURE) ×10
SUT PDS AB 0 CTX 36 PDP370T (SUTURE) IMPLANT
SUT PLAIN 2 0 XLH (SUTURE) ×2 IMPLANT
SUT VIC AB 4-0 KS 27 (SUTURE) ×2 IMPLANT
TOWEL OR 17X24 6PK STRL BLUE (TOWEL DISPOSABLE) ×8 IMPLANT
TRAY FOLEY W/BAG SLVR 14FR (SET/KITS/TRAYS/PACK) ×4 IMPLANT

## 2018-05-26 NOTE — Op Note (Signed)
[name], [name] MEDICAL RECORD SW:[id number] ACCOUNT [id number] DATE OF BIRTH:1971/06/07 FACILITY: [facility] LOCATION: [facility] PHYSICIAN:Elyana Grabski Kathline Magic, MD  OPERATIVE REPORT  DATE OF PROCEDURE:  05/26/2018  PREOPERATIVE DIAGNOSES: 1.  Menorrhagia. 2.  Uterine fibroids. 3.  Anemia. 4.  Pelvic pain.  POSTOPERATIVE DIAGNOSES:   1.  Menorrhagia. 2.  Uterine fibroids. 3.  Anemia. 4.  Pelvic pain.  OPERATION PERFORMED: 1.  Total abdominal hysterectomy. 2.  Bilateral salpingectomy. 3.  Lysis of extensive adhesions.  SURGEON:  Freda Munro, MD  ASSISTANT:  Delsa Bern.    ANESTHESIA:  General.  ANTIBIOTICS:  Ancef 2 grams.  DRAINS:  Foley to bedside drainage.  ESTIMATED BLOOD LOSS:  650 mL  SPECIMENS:  Cervix, uterus, fallopian tubes, fibroids sent to pathology.  FINDINGS:  The patient had extensive omental adhesions to the anterior abdominal wall.  There were also bowel adhesions to the posterior aspect of the uterus and the uterus was adherent to the anterior abdominal wall.  She was also noted to have an 8 cm  fibroid which emanated from the patient's right side.  This involved the entire broad ligament of the right.  DESCRIPTION OF PROCEDURE:  The patient was taken to the operating room where she was placed in the dorsal supine position.  General anesthetic was administered without difficulty.  She was then prepped and draped in the usual fashion for this procedure.   A Foley catheter was placed.  She had a Pfannenstiel incision was made through the previous scars.  On entering the parietal cavity, it was noted that there were extensive omental adhesions to the anterior abdominal wall.  These were taken down with  sharp dissection and also the Bovie.  Once the omentum was free, the bowel was freed from the posterior aspect of the uterus.  An examination revealed normal bilateral ovaries and fallopian tubes.  A large fibroid noted in the right broad ligament as  noted above.   This made it extremely difficult to visualize the round ligament, the infundibulopelvic ligament, the ureter on the right.  The procedure was begun by placing the O'Connor-O'Sullivan retractor and packing the bowel away with two laps.  The  procedure was begun on the patient's left.  The round ligament was isolated, clamped, cut, and ligated with 0 Monocryl suture.  Broad ligament was then opened.  The left fallopian tube was removed by cauterizing the mesosalpinx to the base of the uterus  and then being excised.  The ovarian ligament was then clamped, cut, and ligated x2 with 0 Monocryl suture.  The broad ligament was then opened and skeletonized down to the level of the uterine artery.  The right side was then examined.  Despite multiple  attempts, we could never be confident that the ureter was not involved in this right side.  Therefore, the uterus was lifted up through the incision and a myomectomy performed enucleating the fibroid in the right broad ligament.  Once this was  accomplished, the anatomy appeared to be much more normal and the procedure was begun on the right.  The round ligament was clamped, cut and ligated x2, then transected.  The ovarian ligament was isolated, clamped, cut, and ligated x2.  The broad  ligament was opened and the area was skeletonized down to the uterine artery.  At this point, the bladder was dissected away from the lower uterine segment.  The uterine vessels were then bilaterally clamped, cut, and ligated with 0 Monocryl suture.  To  give better visualization,  the cervix was transected and the uterus removed.  Following this, the cardinal ligaments were serially clamped, cut, and ligated with 0 Monocryl suture.  Once reaching the level of the external os, the vagina was entered and  circumscribed with the scissors.  The specimen was removed.  The angles were closed using 0 Monocryl suture in a Heaney fashion.  The remaining cuff was closed using interrupted 0 Monocryl  suture in figure-of-eight fashion.  The pelvis was then copiously  irrigated.  Any areas of bleeding were made hemostatic with the Bovie.  The right fallopian tube, which had not been previously removed was removed.  Following this, all pedicles appeared to be dry.  The laps were removed.  The O'Connor-O'Sullivan was  removed.  The parietal peritoneum and muscles were then reapproximated in the midline using 0 Monocryl suture in a running fashion.  The fascia was closed using 0 Monocryl suture in a running fashion.  The subcuticular tissue was irrigated and then  closed using 2-0 plain gut in an interrupted fashion.  The skin was closed using 3-0 Vicryl in a subcuticular fashion and Steri-Strips were then placed.  Instrument and lap counts correct x3.  The patient was taken to the recovery room in stable  condition.  TN/NUANCE  D:05/26/2018 T:05/26/2018 JOB:[id number]

## 2018-05-26 NOTE — H&P (Signed)
[name] is an 47 y.o. female who presents to the OR for a TAH/salpingectomy for menorrhagia/fibroids/anemia. She was offered OCP/ablation/iud. Chief Complaint: HPI:  Past Medical History:  Diagnosis Date  . Asthma    dust, mildwe allergies  . Chest tightness    normal stress test done  . H/O cardiovascular stress test 2014   normal no problems    Past Surgical History:  Procedure Laterality Date  . [place], 2004    History reviewed. No pertinent family history. Social History:  reports that she has never smoked. She has never used smokeless tobacco. She reports that she does not drink alcohol or use drugs.  Allergies:  Allergies  Allergen Reactions  . Dust Mite Mixed Allergen Ext [Mite (D. Farinae)]   . Morphine And Related Itching  . Latex Rash and Other (See Comments)    Irritation (latex condom=infection)    Medications Prior to Admission  Medication Sig Dispense Refill  . albuterol (PROVENTIL HFA;VENTOLIN HFA) 108 (90 Base) MCG/ACT inhaler Inhale 1-2 puffs into the lungs every 6 (six) hours as needed for wheezing or shortness of breath.    . levocetirizine (XYZAL) 5 MG tablet Take 5 mg by mouth daily as needed for allergies.  2       Blood pressure 125/74, pulse 68, temperature 98 F (36.7 C), temperature source Oral, resp. rate 16, height 5\' 5"  (1.651 m), weight 74.8 kg, last menstrual period 05/17/2018, SpO2 100 %. General appearance: alert and cooperative Lungs: clear to auscultation bilaterally Heart: regular rate and rhythm, S1, S2 normal, no murmur, click, rub or gallop Pelvic: 11 weeks size uterus Abd- soft, nontender   Lab Results  Component Value Date   WBC 8.6 05/02/2018   HGB 11.7 (L) 05/02/2018   HCT 37.0 05/02/2018   MCV 77.4 (L) 05/02/2018   PLT 245 05/02/2018   No results found for: PREGTESTUR, PREGSERUM, HCG, HCGQUANT   There are no active problems to display for this patient.  IMP/ Menorrhagia  Fibroids         Anemia Plan/ Admit          TAH bilateral salpingectomies ANDERSON,MARK E 05/26/2018, 12:03 PM

## 2018-05-26 NOTE — Anesthesia Procedure Notes (Signed)
Procedure Name: Intubation Date/Time: 05/26/2018 12:28 PM Performed by: Bufford Spikes, CRNA Pre-anesthesia Checklist: Patient identified, Emergency Drugs available, Suction available and Patient being monitored Patient Re-evaluated:Patient Re-evaluated prior to induction Oxygen Delivery Method: Circle system utilized Preoxygenation: Pre-oxygenation with 100% oxygen Induction Type: IV induction Ventilation: Mask ventilation without difficulty Laryngoscope Size: Miller and 2 Grade View: Grade II Tube type: Oral Tube size: 7.0 mm Number of attempts: 1 Airway Equipment and Method: Stylet and Oral airway Placement Confirmation: ETT inserted through vocal cords under direct vision,  positive ETCO2 and breath sounds checked- equal and bilateral Secured at: 21 cm Tube secured with: Tape Dental Injury: Teeth and Oropharynx as per pre-operative assessment

## 2018-05-26 NOTE — Anesthesia Preprocedure Evaluation (Addendum)
Anesthesia Evaluation  Patient identified by MRN, date of birth, ID band Patient awake    Reviewed: Allergy & Precautions, NPO status , Patient's Chart, lab work & pertinent test results  History of Anesthesia Complications Negative for: history of anesthetic complications  Airway Mallampati: II       Dental  (+) Teeth Intact, Dental Advisory Given   Pulmonary asthma ,    Pulmonary exam normal        Cardiovascular negative cardio ROS Normal cardiovascular exam     Neuro/Psych negative neurological ROS  negative psych ROS   GI/Hepatic negative GI ROS, Neg liver ROS,   Endo/Other    Renal/GU negative Renal ROS  negative genitourinary   Musculoskeletal negative musculoskeletal ROS (+)   Abdominal Normal abdominal exam  (+)   Peds  Hematology negative hematology ROS (+)   Anesthesia Other Findings   Reproductive/Obstetrics negative OB ROS                             Anesthesia Physical Anesthesia Plan  ASA: II  Anesthesia Plan: General   Post-op Pain Management:    Induction: Intravenous  PONV Risk Score and Plan: 3 and Ondansetron, Dexamethasone, Treatment may vary due to age or medical condition and Scopolamine patch - Pre-op  Airway Management Planned: Oral ETT  Additional Equipment:   Intra-op Plan:   Post-operative Plan: Extubation in OR  Informed Consent: I have reviewed the patients History and Physical, chart, labs and discussed the procedure including the risks, benefits and alternatives for the proposed anesthesia with the patient or authorized representative who has indicated his/her understanding and acceptance.   Dental advisory given  Plan Discussed with: CRNA and Surgeon  Anesthesia Plan Comments:         Anesthesia Quick Evaluation

## 2018-05-26 NOTE — Plan of Care (Signed)
Pts. Condition will continue to improve 

## 2018-05-26 NOTE — Anesthesia Postprocedure Evaluation (Signed)
Anesthesia Post Note  Patient: [name]  Procedure(s) Performed: HYSTERECTOMY ABDOMINAL (N/A Abdomen) BILATERAL SALPINGECTOMY (Bilateral Abdomen)     Patient location during evaluation: PACU Anesthesia Type: General Level of consciousness: awake and alert and oriented Pain management: pain level controlled Vital Signs Assessment: post-procedure vital signs reviewed and stable Respiratory status: spontaneous breathing, nonlabored ventilation, respiratory function stable and patient connected to nasal cannula oxygen Cardiovascular status: blood pressure returned to baseline and stable Postop Assessment: no apparent nausea or vomiting Anesthetic complications: no    Pain Goal: Patients Stated Pain Goal: 3 (05/26/18 1830)               Magally Vahle A.

## 2018-05-26 NOTE — Transfer of Care (Signed)
Immediate Anesthesia Transfer of Care Note  Patient: [name]  Procedure(s) Performed: HYSTERECTOMY ABDOMINAL (N/A Abdomen) BILATERAL SALPINGECTOMY (Bilateral Abdomen)  Patient Location: PACU  Anesthesia Type:General  Level of Consciousness: awake, alert  and patient cooperative  Airway & Oxygen Therapy: Patient Spontanous Breathing and Patient connected to nasal cannula oxygen  Post-op Assessment: Report given to RN, Post -op Vital signs reviewed and stable and Patient moving all extremities X 4  Post vital signs: Reviewed and stable  Last Vitals:  Vitals Value Taken Time  BP 120/92 05/26/2018  3:15 PM  Temp    Pulse 62 05/26/2018  3:18 PM  Resp 13 05/26/2018  3:18 PM  SpO2 100 % 05/26/2018  3:18 PM  Vitals shown include unvalidated device data.  Last Pain:  Vitals:   05/26/18 1156  TempSrc: Oral  PainSc: 3          Complications: No apparent anesthesia complications

## 2018-05-27 ENCOUNTER — Encounter (HOSPITAL_COMMUNITY): Payer: Self-pay | Admitting: Obstetrics and Gynecology

## 2018-05-27 ENCOUNTER — Other Ambulatory Visit: Payer: Self-pay

## 2018-05-27 LAB — CBC
HCT: 27.2 % — ABNORMAL LOW (ref 36.0–46.0)
Hemoglobin: 8.9 g/dL — ABNORMAL LOW (ref 12.0–15.0)
MCH: 24.9 pg — ABNORMAL LOW (ref 26.0–34.0)
MCHC: 32.7 g/dL (ref 30.0–36.0)
MCV: 76 fL — ABNORMAL LOW (ref 78.0–100.0)
Platelets: 200 10*3/uL (ref 150–400)
RBC: 3.58 MIL/uL — ABNORMAL LOW (ref 3.87–5.11)
RDW: 15.3 % (ref 11.5–15.5)
WBC: 8.9 10*3/uL (ref 4.0–10.5)

## 2018-05-27 NOTE — Plan of Care (Signed)
  Problem: Safety: Goal: Ability to remain free from injury will improve Outcome: Progressing   Problem: Education: Goal: Knowledge of the prescribed therapeutic regimen will improve Outcome: Progressing

## 2018-05-27 NOTE — Anesthesia Postprocedure Evaluation (Signed)
Anesthesia Post Note  Patient: [name]  Procedure(s) Performed: HYSTERECTOMY ABDOMINAL (N/A Abdomen) BILATERAL SALPINGECTOMY (Bilateral Abdomen)     Patient location during evaluation: [facility] Anesthesia Type: General Level of consciousness: awake and alert Pain management: pain level controlled Vital Signs Assessment: post-procedure vital signs reviewed and stable Respiratory status: spontaneous breathing Cardiovascular status: blood pressure returned to baseline Postop Assessment: adequate PO intake, no apparent nausea or vomiting and able to ambulate Anesthetic complications: no    Last Vitals:  Vitals:   05/26/18 2325 05/27/18 0406  BP: (!) 93/46 (!) 82/54  Pulse: 77 72  Resp: 18 18  Temp: 37.6 C 37 C  SpO2: 97% 100%    Last Pain:  Vitals:   05/27/18 0730  TempSrc:   PainSc: 5    Pain Goal: Patients Stated Pain Goal: 0 (05/27/18 0730)               Ailene Ards

## 2018-05-27 NOTE — Progress Notes (Signed)
POD#1 Pt states that she is doing well. Feels heaviness in pelvis. Tolerating diet. Adequate pain control . Vitals B/P low but not tachy and not symptomatic Abd- soft/ non distended Dressing dry IMP/ Stabl;e Plan/ Will d/c foley, IVFs/shower

## 2018-05-28 MED ORDER — DOCUSATE SODIUM 100 MG PO CAPS: 100.0000 mg | ORAL_CAPSULE | Freq: Two times a day (BID) | ORAL | 0 refills | Status: DC

## 2018-05-28 MED ORDER — IBUPROFEN 600 MG PO TABS: 600.0000 mg | ORAL_TABLET | Freq: Four times a day (QID) | ORAL | 0 refills | Status: DC | PRN

## 2018-05-28 MED ORDER — HYDROMORPHONE HCL 2 MG PO TABS: 2.0000 mg | ORAL_TABLET | ORAL | 0 refills | Status: DC | PRN

## 2018-05-28 NOTE — Progress Notes (Signed)
POD#2  Pt without complaints. No vaginal bleeding. Adequate pain control. Tolerating diet

## 2018-05-28 NOTE — Discharge Instructions (Signed)
Abdominal Hysterectomy, Care After °This sheet gives you information about how to care for yourself after your procedure. Your doctor may also give you more specific instructions. If you have problems or questions, contact your doctor. °Follow these instructions at home: °Bathing °· Do not take baths, swim, or use a hot tub until your doctor says it is okay. Ask your doctor if you can take showers. You may only be allowed to take sponge baths for bathing. °· Keep the bandage (dressing) dry until your doctor says it can be taken off. °Surgical cut ( °incision) care °· Follow instructions from your doctor about how to take care of your cut from surgery. Make sure you: °? Wash your hands with soap and water before you change your bandage (dressing). If you cannot use soap and water, use hand sanitizer. °? Change your bandage as told by your doctor. °? Leave stitches (sutures), skin glue, or skin tape (adhesive) strips in place. They may need to stay in place for 2 weeks or longer. If tape strips get loose and curl up, you may trim the loose edges. Do not remove tape strips completely unless your doctor says it is okay. °· Check your surgical cut area every day for signs of infection. Check for: °? Redness, swelling, or pain. °? Fluid or blood. °? Warmth. °? Pus or a bad smell. °Activity °· Do gentle, daily exercise as told by your doctor. You may be told to take short walks every day and go farther each time. °· Do not lift anything that is heavier than 10 lb (4.5 kg), or the limit that your doctor tells you, until he or she says that it is safe. °· Do not drive or use heavy machinery while taking prescription pain medicine. °· Do not drive for 24 hours if you were given a medicine to help you relax (sedative). °· Follow your doctor's advice about exercise, driving, and general activities. Ask your doctor what activities are safe for you. °Lifestyle °· Do not douche, use tampons, or have sex for at least 6 weeks or as  told by your doctor. °· Do not drink alcohol until your doctor says it is okay. °· Drink enough fluid to keep your pee (urine) clear or pale yellow. °· Try to have someone at home with you for the first 1-2 weeks to help. °· Do not use any products that contain nicotine or tobacco, such as cigarettes and e-cigarettes. These can slow down healing. If you need help quitting, ask your doctor. °General instructions °· Take over-the-counter and prescription medicines only as told by your doctor. °· Do not take aspirin or ibuprofen. These medicines can cause bleeding. °· To prevent or treat constipation while you are taking prescription pain medicine, your doctor may suggest that you: °? Drink enough fluid to keep your urine clear or pale yellow. °? Take over-the-counter or prescription medicines. °? Eat foods that are high in fiber, such as: °§ Fresh fruits and vegetables. °§ Whole grains. °§ Beans. °? Limit foods that are high in fat and processed sugars, such as fried and sweet foods. °· Keep all follow-up visits as told by your doctor. This is important. °Contact a doctor if: °· You have chills or fever. °· You have redness, swelling, or pain around your cut. °· You have fluid or blood coming from your cut. °· Your cut feels warm to the touch. °· You have pus or a bad smell coming from your cut. °· Your cut breaks   open. °· You feel dizzy or light-headed. °· You have pain or bleeding when you pee. °· You keep having watery poop (diarrhea). °· You keep feeling sick to your stomach (nauseous) or keep throwing up (vomiting). °· You have unusual fluid (discharge) coming from your vagina. °· You have a rash. °· You have a reaction to your medicine. °· Your pain medicine does not help. °Get help right away if: °· You have a fever and your symptoms get worse all of a sudden. °· You have very bad belly (abdominal) pain. °· You are short of breath. °· You pass out (faint). °· You have pain, swelling, or redness of your  leg. °· You bleed a lot from your vagina and notice clumps of blood (clots). °Summary °· Do not take baths, swim, or use a hot tub until your doctor says it is okay. Ask your doctor if you can take showers. You may only be allowed to take sponge baths for bathing. °· Follow your doctor's advice about exercise, driving, and general activities. Ask your doctor what activities are safe for you. °· Do not lift anything that is heavier than 10 lb (4.5 kg), or the limit that your doctor tells you, until he or she says that it is safe. °· Try to have someone at home with you for the first 1-2 weeks to help. °This information is not intended to replace advice given to you by your health care provider. Make sure you discuss any questions you have with your health care provider. °Document Released: 05/19/2008 Document Revised: 07/29/2016 Document Reviewed: 07/29/2016 °Elsevier Interactive Patient Education © 2017 [employer] ° °

## 2018-05-28 NOTE — Discharge Summary (Signed)
Pt was admitted for a TAH for menorrhagia/fibroids/anemia. She underwent this procedure on Thursday. She had significant adhesions. See OP note for complete description. Path pending. Post op uncomplicated. Hgb post op 8.9 + flatus, Incision healing well Plan/ Will return to office in 3-4 days for an incision check

## 2019-06-18 ENCOUNTER — Other Ambulatory Visit: Payer: Self-pay | Admitting: Internal Medicine

## 2020-05-22 ENCOUNTER — Ambulatory Visit: Payer: Self-pay | Admitting: Cardiology

## 2020-05-23 NOTE — Progress Notes (Signed)
Patient referred by Jilda Panda, MD for edema  Subjective:   [name], female    DOB: 12-Jun-1971, 49 y.o.   MRN: [id number]   Chief Complaint  Patient presents with  . Edema  . New Patient (Initial Visit)     HPI  49 y.o. [place] female with leg edema  Patient is a Education administrator.  She stays active with work, exercises regularly with Pilates 6 days a week.  She has noticed leg edema for last 3 years, worse towards the end of the day.  Patient has had multiple abdominal surgeries, including 2 cesarean sections, 2 D&C, hysterectomy, laparoscopies.  She denies dyspnea, orthopnea, PND, chest pain.  Recent BNP is mildly elevated at 132.  She does not have hypertension.  Patient reports difficulty coping with stress and anxiety.  Past Medical History:  Diagnosis Date  . Asthma    dust, mildwe allergies  . Chest tightness    normal stress test done  . H/O cardiovascular stress test 2014   normal no problems     Past Surgical History:  Procedure Laterality Date  . ABDOMINAL HYSTERECTOMY N/A 05/26/2018   Procedure: HYSTERECTOMY ABDOMINAL;  Surgeon: Olga Millers, MD;  Location: [facility];  Service: Gynecology;  Laterality: N/A;  . BILATERAL SALPINGECTOMY Bilateral 05/26/2018   Procedure: BILATERAL SALPINGECTOMY;  Surgeon: Olga Millers, MD;  Location: [facility];  Service: Gynecology;  Laterality: Bilateral;  . [place], 2004     Social History   Tobacco Use  Smoking Status Never Smoker  Smokeless Tobacco Never Used    Social History   Substance and Sexual Activity  Alcohol Use No     Family History  Problem Relation Age of Onset  . Diabetes Mother   . Hypertension Father   . Diabetes Father   . Thyroid disease Sister   Jorje Guild taking them 10 days ago follow-up with her and make sure he is taking what he says he is taking   Current Outpatient Medications on File Prior to Visit  Medication Sig Dispense Refill  . albuterol  (PROVENTIL HFA;VENTOLIN HFA) 108 (90 Base) MCG/ACT inhaler Inhale 1-2 puffs into the lungs every 6 (six) hours as needed for wheezing or shortness of breath.    . docusate sodium (COLACE) 100 MG capsule Take 1 capsule (100 mg total) by mouth 2 (two) times daily. 10 capsule 0  . HYDROmorphone (DILAUDID) 2 MG tablet Take 1 tablet (2 mg total) by mouth every 3 (three) hours as needed for severe pain. 30 tablet 0  . ibuprofen (ADVIL,MOTRIN) 600 MG tablet Take 1 tablet (600 mg total) by mouth every 6 (six) hours as needed (mild pain). 30 tablet 0  . levocetirizine (XYZAL) 5 MG tablet Take 5 mg by mouth daily as needed for allergies.  2   No current facility-administered medications on file prior to visit.    Cardiovascular and other pertinent studies:  EKG 05/24/2020:  Sinus rhythm 76 bpm Left atrial enlargement Nonspecific T wave abnormality    Recent labs: 02/16/2020: Glucose 107, BUN/Cr ?/0.63. EGFR 107. Na/K 139/4.3. Rest of the CMP normal H/H 11.5/37.2. MCV . Platelets 295 BNP 132    Review of Systems  Cardiovascular: Positive for leg swelling. Negative for chest pain, dyspnea on exertion, palpitations and syncope.  Psychiatric/Behavioral:       Stress         Vitals:   05/24/20 0821  BP: 113/83  Pulse: 83  SpO2:  98%     Body mass index is 28.67 kg/m. Filed Weights   05/24/20 0821  Weight: 167 lb (75.8 kg)     Objective:   Physical Exam Vitals and nursing note reviewed.  Constitutional:      General: She is not in acute distress. Neck:     Vascular: No JVD.  Cardiovascular:     Rate and Rhythm: Normal rate and regular rhythm.     Heart sounds: Normal heart sounds. No murmur heard.   Pulmonary:     Effort: Pulmonary effort is normal.     Breath sounds: Normal breath sounds. No wheezing or rales.  Musculoskeletal:     Right lower leg: Edema (Trace) present.     Left lower leg: Edema (Trace) present.            Assessment & Recommendations:    49 y.o. [name] female with leg edema  Leg edema: Most likely due to mild venous insufficiency.  Risk factors include multiple prior abdominal surgeries.  Recommend negative patient at night, regular use of compression stockings, lower abdominal exercises.  If no improvement after 3 months, could consider venous duplex ultrasound and consideration for endovenous ablation referral.  Although clinical suspicion for heart failure is low, given mildly elevated BNP, will also obtain echocardiogram.  Stress/anxiety: Agree with referral for psychology/stress management.       Thank you for referring the patient to [place]. Please feel free to contact with any questions.   Nigel Mormon, MD Pager: [phone] Office: [phone]

## 2020-05-24 ENCOUNTER — Ambulatory Visit: Payer: Self-pay | Admitting: Cardiology

## 2020-05-24 ENCOUNTER — Encounter: Payer: Self-pay | Admitting: Cardiology

## 2020-05-24 ENCOUNTER — Other Ambulatory Visit: Payer: Self-pay

## 2020-05-24 ENCOUNTER — Ambulatory Visit: Payer: BC Managed Care – PPO | Admitting: Cardiology

## 2020-05-24 VITALS — BP 113/83 | HR 83 | Ht 64.0 in | Wt 167.0 lb

## 2020-05-24 DIAGNOSIS — R600 Localized edema: Secondary | ICD-10-CM

## 2020-05-29 ENCOUNTER — Other Ambulatory Visit: Payer: BC Managed Care – PPO

## 2020-06-12 ENCOUNTER — Other Ambulatory Visit: Payer: BC Managed Care – PPO

## 2020-06-13 ENCOUNTER — Ambulatory Visit: Payer: BC Managed Care – PPO

## 2020-06-13 ENCOUNTER — Other Ambulatory Visit: Payer: Self-pay

## 2020-06-13 DIAGNOSIS — R600 Localized edema: Secondary | ICD-10-CM

## 2020-06-14 NOTE — Progress Notes (Signed)
Spoke with patient. Patient voiced understanding.

## 2020-06-28 ENCOUNTER — Other Ambulatory Visit: Payer: Self-pay | Admitting: *Deleted

## 2020-06-28 DIAGNOSIS — R600 Localized edema: Secondary | ICD-10-CM

## 2020-07-03 ENCOUNTER — Other Ambulatory Visit: Payer: Self-pay | Admitting: Gastroenterology

## 2020-07-03 DIAGNOSIS — R1312 Dysphagia, oropharyngeal phase: Secondary | ICD-10-CM

## 2020-07-03 DIAGNOSIS — R1319 Other dysphagia: Secondary | ICD-10-CM

## 2020-07-05 ENCOUNTER — Ambulatory Visit
Admission: RE | Admit: 2020-07-05 | Discharge: 2020-07-05 | Disposition: A | Discharge status: Home / Self Care | Payer: BC Managed Care – PPO | Source: Ambulatory Visit | Attending: Gastroenterology | Admitting: Gastroenterology

## 2020-07-05 ENCOUNTER — Other Ambulatory Visit (HOSPITAL_COMMUNITY): Payer: Self-pay

## 2020-07-05 DIAGNOSIS — R1312 Dysphagia, oropharyngeal phase: Secondary | ICD-10-CM

## 2020-07-05 DIAGNOSIS — R1319 Other dysphagia: Secondary | ICD-10-CM

## 2020-07-05 DIAGNOSIS — R1310 Dysphagia, unspecified: Secondary | ICD-10-CM

## 2020-07-08 ENCOUNTER — Ambulatory Visit (INDEPENDENT_AMBULATORY_CARE_PROVIDER_SITE_OTHER): Payer: Self-pay | Admitting: Physician Assistant

## 2020-07-08 ENCOUNTER — Other Ambulatory Visit: Payer: Self-pay

## 2020-07-08 ENCOUNTER — Ambulatory Visit (HOSPITAL_COMMUNITY)
Admission: RE | Admit: 2020-07-08 | Discharge: 2020-07-08 | Disposition: A | Discharge status: Home / Self Care | Payer: BC Managed Care – PPO | Source: Ambulatory Visit | Attending: Physician Assistant | Admitting: Physician Assistant

## 2020-07-08 ENCOUNTER — Encounter: Payer: Self-pay | Admitting: Physician Assistant

## 2020-07-08 VITALS — BP 112/74 | HR 63 | Temp 98.3°F | Resp 20 | Ht 64.0 in | Wt 172.4 lb

## 2020-07-08 DIAGNOSIS — R600 Localized edema: Secondary | ICD-10-CM | POA: Diagnosis present

## 2020-07-08 DIAGNOSIS — I872 Venous insufficiency (chronic) (peripheral): Secondary | ICD-10-CM | POA: Diagnosis not present

## 2020-07-08 NOTE — Progress Notes (Signed)
Requested by:  Jilda Panda, MD [id number],  [place] [address]  Reason for consultation: lower extremity swelling   History of Present Illness   [name] is a 49 y.o. (Jan 18, 1971) female who presents for evaluation of bilateral lower extremity swelling. She states she has had issues with swelling for several years now on and off. She notices the swelling more after traveling and also after work. 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 She denies any pain or aching in her legs but she does report fatigue in her legs. She says at end of day her legs feel like they just don't want to carry her and she also feels less energy to do activities due to her legs feeling tired. She does elevate some and she will have some improvement however some swelling is usually still present even upon first waking in the mornings. She just recently got thigh high and knee high compression stockings 15-20 mmHg that she has worn over past 10 days and she has noticed some improvement in how her legs feel. She otherwise denies any visible varicose veins, any burning, throbbing, heaviness, or skin changes. She additionally has some concerns that this issue has resulted following hysterectomy for fibroids and that her OB told her she could have some complications in the veins following the surgery. She denies any pelvic discomfort or varicosities. She has no family history of venous disease but she does report her father having history of unprovoked DVT. She is unsure if he ever had hypercoagulable workup but he has no heart issues or malignancy that she is aware of.   Venous symptoms include: swelling Onset/duration:  >3 years Occupation:  Pharmacist Aggravating factors: (sitting, standing) Alleviating factors: (elevation) Compression:  yes Helps:  yes Pain medications: none Previous vein procedures:  none History of DVT: no  personal history, father has DVT  Past Medical History:  Diagnosis Date  . Asthma    dust, mildwe allergies  . Chest tightness    normal stress test done  . H/O cardiovascular stress test 2014   normal no problems    Past Surgical History:  Procedure Laterality Date  . ABDOMINAL HYSTERECTOMY N/A 05/26/2018   Procedure: HYSTERECTOMY ABDOMINAL;  Surgeon: Olga Millers, MD;  Location: [facility];  Service: Gynecology;  Laterality: N/A;  . BILATERAL SALPINGECTOMY Bilateral 05/26/2018   Procedure: BILATERAL SALPINGECTOMY;  Surgeon: Olga Millers, MD;  Location: [facility];  Service: Gynecology;  Laterality: Bilateral;  . [place], 2004    Social History   Socioeconomic History  . Marital status: Married    Spouse name: Not on file  . Number of children: 2  . Years of education: Not on file  . Highest education level: Not on file  Occupational History  . Not on file  Tobacco Use  . Smoking status: Never Smoker  . Smokeless tobacco: Never Used  Vaping Use  . Vaping Use: Never used  Substance and Sexual Activity  . Alcohol use: No  . Drug use: No  . Sexual activity: Not on file  Other Topics Concern  . Not on file  Social History Narrative  . Not on file   Social Determinants of Health   Financial Resource Strain:   . Difficulty of Paying Living Expenses: Not on file  Food Insecurity:   . Worried About Charity fundraiser in  the Last Year: Not on file  . Ran Out of Food in the Last Year: Not on file  Transportation Needs:   . Lack of Transportation (Medical): Not on file  . Lack of Transportation (Non-Medical): Not on file  Physical Activity:   . Days of Exercise per Week: Not on file  . Minutes of Exercise per Session: Not on file  Stress:   . Feeling of Stress : Not on file  Social Connections:   . Frequency of Communication with Friends and Family: Not on file  . Frequency of Social Gatherings with Friends and Family: Not on file  . Attends  Religious Services: Not on file  . Active Member of Clubs or Organizations: Not on file  . Attends Archivist Meetings: Not on file  . Marital Status: Not on file  Intimate Partner Violence:   . Fear of Current or Ex-Partner: Not on file  . Emotionally Abused: Not on file  . Physically Abused: Not on file  . Sexually Abused: Not on file    Family History  Problem Relation Age of Onset  . Diabetes Mother   . Hypertension Father   . Diabetes Father   . Thyroid disease Sister     Current Outpatient Medications  Medication Sig Dispense Refill  . albuterol (PROVENTIL HFA;VENTOLIN HFA) 108 (90 Base) MCG/ACT inhaler Inhale 1-2 puffs into the lungs every 6 (six) hours as needed for wheezing or shortness of breath.    . Cholecalciferol 1.25 MG (50000 UT) capsule cholecalciferol (vitamin D3) 1,250 mcg (50,000 unit) capsule  TK 1 C PO Q WEEK    . cyanocobalamin (,VITAMIN B-12,) 1000 MCG/ML injection cyanocobalamin (vit B-12) 1,000 mcg/mL injection solution  INJECT 1 ML Q MONTH    . LORazepam (ATIVAN) 0.5 MG tablet Take 1 tablet by mouth as needed.    . pantoprazole (PROTONIX) 40 MG tablet Take 40 mg by mouth every morning.    . sucralfate (CARAFATE) 1 g tablet Take 1 g by mouth 2 (two) times daily.     No current facility-administered medications for this visit.    Allergies  Allergen Reactions  . Dust Mite Mixed Allergen Ext [Mite (D. Farinae)]   . Morphine And Related Itching  . Latex Rash and Other (See Comments)    Irritation (latex condom=infection)   REVIEW OF SYSTEMS (negative unless checked):   Cardiac:  []  Chest pain or chest pressure? []  Shortness of breath upon activity? []  Shortness of breath when lying flat? []  Irregular heart rhythm?  Vascular:  []  Pain in calf, thigh, or hip brought on by walking? []  Pain in feet at night that wakes you up from your sleep? []  Blood clot in your veins? [x]  Leg swelling?  Pulmonary:  []  Oxygen at home? []  Productive  cough? []  Wheezing?  Neurologic:  []  Sudden weakness in arms or legs? []  Sudden numbness in arms or legs? []  Sudden onset of difficult speaking or slurred speech? []  Temporary loss of vision in one eye? []  Problems with dizziness?  Gastrointestinal:  []  Blood in stool? []  Vomited blood?  Genitourinary:  []  Burning when urinating? []  Blood in urine?  Psychiatric:  []  Major depression  Hematologic:  []  Bleeding problems? []  Problems with blood clotting?  Dermatologic:  []  Rashes or ulcers?  Constitutional:  []  Fever or chills?  Ear/Nose/Throat:  []  Change in hearing? []  Nose bleeds? []  Sore throat?  Musculoskeletal:  []  Back pain? []  Joint pain? []  Muscle pain?   Physical  Examination     Vitals:   07/08/20 1349  BP: 112/74  Pulse: 63  Resp: 20  Temp: 98.3 F (36.8 C)  TempSrc: Temporal  SpO2: 98%  Weight: 172 lb 6.4 oz (78.2 kg)  Height: 5\' 4"  (1.626 m)   Body mass index is 29.59 kg/m.  General:  WDWN in NAD; vital signs documented above Gait: Normal HENT: WNL, normocephalic Pulmonary: normal non-labored breathing Cardiac: regular rate and rhythm Vascular Exam/Pulses:2+ radial pulses, 2+ femoral, popliteal DP and PT pulses bilaterally Extremities: without varicose veins, without reticular veins, without edema, without stasis pigmentation, without lipodermatosclerosis, without ulcers, mild bilateral swelling  Musculoskeletal: no muscle wasting or atrophy  Neurologic: A&O X 3;  No focal weakness or paresthesias are detected Psychiatric:  The pt has Normal affect.  Non-invasive Vascular Imaging   BLE Venous Insufficiency Duplex (07/08/20):   RLE:   No DVT and SVT  GSV reflux at [facility]  GSV diameter 0.30-0.48  No SSV reflux   CFV deep venous reflux   LLE:  No DVT and SVT   GSV reflux at [facility]  GSV diameter  0/17-0.40  No SSV reflux   No deep venous reflux   Medical Decision Making   Tammey P Brandel is a 49 y.o. female who  presents with: BLE chronic venous insufficiency with swelling. Her duplex today shows some CFV deep reflux on the RLE and bilateral superficial reflux in her greater saphenous veins at the [facility] otherwise normal venous function. She has no DVT or SVT bilaterally. Her veins are relatively small bilaterally.   Based on the patient's history and examination, I recommend continued conservative therapy with elevation, compression stockings and exercise  Based on her duplex findings she will benefit from continued use of her 15-20 mmHg compression stockings  Provided reassurance that she she has minimal venous disease, no DVT and no arterial disease  She will follow up as needed if she has new or worsening symptoms    Karoline Caldwell, PA-C [facility]: [phone]  07/08/2020, 2:31 PM  Clinic MD: Dr. Trula Slade

## 2020-07-12 ENCOUNTER — Ambulatory Visit (HOSPITAL_COMMUNITY)
Admission: RE | Admit: 2020-07-12 | Discharge: 2020-07-12 | Disposition: A | Discharge status: Home / Self Care | Payer: BC Managed Care – PPO | Source: Ambulatory Visit | Attending: Gastroenterology | Admitting: Gastroenterology

## 2020-07-12 ENCOUNTER — Other Ambulatory Visit: Payer: Self-pay

## 2020-07-12 DIAGNOSIS — R1310 Dysphagia, unspecified: Secondary | ICD-10-CM | POA: Diagnosis present

## 2020-07-12 NOTE — Progress Notes (Signed)
Modified Barium Swallow Progress Note  Patient Details  Name: [name] MRN: [id number] Date of Birth: 05/30/1971  Today's Date: 07/12/2020  Modified Barium Swallow completed.  Full report located under Chart Review in the Imaging Section.  Brief recommendations include the following:  Clinical Impression  Pt demonstrates mild oral dysphagia with instance of premature spillge with liquids in particular, but also with solids resulting in 10% bolus loss to the valleculae and pyriform sinuses pre swallow. This could contribute to coughing/choking episode with particulate textures. Provided compensatory strategies and visual biofeedback to increase pts awareness. With cues pt was able to orally contain bolus and initiate swallow simultaneously with bolus transit.  Esophageal sweep did show some pill fragments and thin liquids in distal esophagus surging to proximal esophagus.  Pt additionally reports a constellation of symptoms often attributable to a hypersensitive cough and was observed to have frequent hard coughing after consumption of solids. Offered education regarding the detrimental nature of frequent coughing to laryngeal tissue and the benefit of cough prevention and suppression techniques. Also discussed eliminating acidic foods that could contribute to laryngeal inflammation if present. Pt may benefit from f/u with OP SLP skilled in treatment for chronic cough if warranted.    Swallow Evaluation Recommendations       SLP Diet Recommendations: Regular solids;Thin liquid   Liquid Administration via: Cup;Straw   Medication Administration: Whole meds with liquid   Supervision: Patient able to self feed   Compensations: Slow rate;Small sips/bites;Chin tuck;Follow solids with liquid   Postural Changes: Seated upright at 90 degrees   Oral Care Recommendations: Oral care BID       Herbie Baltimore, MA [place] Pager [phone] Office  [phone]  Lynann Beaver 07/12/2020,12:30 PM

## 2020-08-28 ENCOUNTER — Other Ambulatory Visit: Payer: Self-pay

## 2020-08-28 ENCOUNTER — Ambulatory Visit: Payer: BC Managed Care – PPO | Admitting: Cardiology

## 2020-08-28 ENCOUNTER — Encounter: Payer: Self-pay | Admitting: Cardiology

## 2020-08-28 VITALS — BP 126/81 | HR 80 | Ht 64.0 in | Wt 177.0 lb

## 2020-08-28 DIAGNOSIS — R600 Localized edema: Secondary | ICD-10-CM

## 2020-08-28 MED ORDER — FUROSEMIDE 20 MG PO TABS: 20.0000 mg | ORAL_TABLET | Freq: Every day | ORAL | 1 refills | Status: DC | PRN

## 2020-08-28 NOTE — Progress Notes (Signed)
Patient referred by Tammy Panda, MD for edema  Subjective:   [name], female    DOB: [birth year], 50 y.o.   MRN: [id number]   Chief Complaint  Patient presents with  . Leg Swelling  . Follow-up    3 months     HPI  50 y.o. [name] female with leg edema  Patient has continues to have leg swelling in spite of wearing compression stockings. She has missed calls from [facility] V[facility] surgery re: scheduling appts for venous duplex for possible venous insufficiency.   Initial consultation HPI 05/2020: Patient is a Education administrator.  She stays active with work, exercises regularly with Pilates 6 days a week.  She has noticed leg edema for last 3 years, worse towards the end of the day.  Patient has had multiple abdominal surgeries, including 2 cesarean sections, 2 D&C, hysterectomy, laparoscopies.  She denies dyspnea, orthopnea, PND, chest pain.  Recent BNP is mildly elevated at 132.  She does not have hypertension.  Patient reports difficulty coping with stress and anxiety.    Current Outpatient Medications on File Prior to Visit  Medication Sig Dispense Refill  . albuterol (PROVENTIL HFA;VENTOLIN HFA) 108 (90 Base) MCG/ACT inhaler Inhale 1-2 puffs into the lungs every 6 (six) hours as needed for wheezing or shortness of breath.    . Cholecalciferol 1.25 MG (50000 UT) capsule cholecalciferol (vitamin D3) 1,250 mcg (50,000 unit) capsule  TK 1 C PO Q WEEK    . cyanocobalamin (,VITAMIN B-12,) 1000 MCG/ML injection cyanocobalamin (vit B-12) 1,000 mcg/mL injection solution  INJECT 1 ML Q MONTH    . LORazepam (ATIVAN) 0.5 MG tablet Take 1 tablet by mouth as needed.    . pantoprazole (PROTONIX) 40 MG tablet Take 40 mg by mouth every morning.    . sucralfate (CARAFATE) 1 g tablet Take 1 g by mouth 2 (two) times daily.     No current facility-administered medications on file prior to visit.    Cardiovascular and other pertinent studies:  Echocardiogram  06/13/2020:  Left ventricle cavity is normal in size and wall thickness. Normal global  wall motion. Normal LV systolic function with EF 59%. Normal diastolic  filling pattern.  No significant valvular abnormality.  Normal right atrial pressure.   EKG 05/24/2020:  Sinus rhythm 76 bpm Left atrial enlargement Nonspecific T wave abnormality    Recent labs: 02/16/2020: Glucose 107, BUN/Cr ?/0.63. EGFR 107. Na/K 139/4.3. Rest of the CMP normal H/H 11.5/37.2. MCV . Platelets 295 BNP 132     Review of Systems  Cardiovascular: Positive for leg swelling. Negative for chest pain, dyspnea on exertion, palpitations and syncope.  Psychiatric/Behavioral:       Stress         Vitals:   08/28/20 0842  BP: 126/81  Pulse: 80  SpO2: 99%     Body mass index is 30.38 kg/m. Filed Weights   08/28/20 0842  Weight: 177 lb (80.3 kg)     Objective:   Physical Exam Vitals and nursing note reviewed.  Constitutional:      General: She is not in acute distress. Neck:     Vascular: No JVD.  Cardiovascular:     Rate and Rhythm: Normal rate and regular rhythm.     Heart sounds: Normal heart sounds. No murmur heard.   Pulmonary:     Effort: Pulmonary effort is normal.     Breath sounds: Normal breath sounds. No wheezing or rales.  Musculoskeletal:  Right lower leg: Edema (Trace) present.     Left lower leg: Edema (Trace) present.            Assessment & Recommendations:   50 y.o. [name] female with leg edema  Leg edema: Most likely due to mild venous insufficiency.  Risk factors include multiple prior abdominal surgeries.  This has persisted in spite of compression stockings use and leg elevation for 3 months.  Recommend duplex evaluation for venous insufficiency.  Till then,, may use low dose lasix as needed for leg edema.   F/u prn   Tammy Mormon, MD Pager: [phone] Office: [phone]

## 2020-09-09 ENCOUNTER — Other Ambulatory Visit: Payer: Self-pay

## 2020-09-09 ENCOUNTER — Ambulatory Visit (INDEPENDENT_AMBULATORY_CARE_PROVIDER_SITE_OTHER): Payer: BC Managed Care – PPO | Admitting: Otolaryngology

## 2020-09-09 VITALS — Temp 97.0°F

## 2020-09-09 DIAGNOSIS — R1314 Dysphagia, pharyngoesophageal phase: Secondary | ICD-10-CM | POA: Diagnosis not present

## 2020-09-09 DIAGNOSIS — K219 Gastro-esophageal reflux disease without esophagitis: Secondary | ICD-10-CM | POA: Diagnosis not present

## 2020-09-09 DIAGNOSIS — J310 Chronic rhinitis: Secondary | ICD-10-CM | POA: Diagnosis not present

## 2020-09-09 NOTE — Progress Notes (Signed)
HPI: [name] is a 50 y.o. female who presents for evaluation of swallowing.  She complains of a chronic sensation of something in her throat and points to the area of the laryngeal cartilage and feels like she always has something stuck there that she is always clearing her throat.  But she really does not clear much out.  She has had upper endoscopy with GI.  She has also had a modified barium swallow and has seen speech therapy concerning swallowing problems. She does have history of allergies and uses Flonase intermittently and antihistamines on a as needed basis.  She is also used saline irrigation in the past.  She has mild nasal congestion. She has no difficulty swallowing or eating meals.  Past Medical History:  Diagnosis Date  . Asthma    dust, mildwe allergies  . Chest tightness    normal stress test done  . H/O cardiovascular stress test 2014   normal no problems   Past Surgical History:  Procedure Laterality Date  . ABDOMINAL HYSTERECTOMY N/A 05/26/2018   Procedure: HYSTERECTOMY ABDOMINAL;  Surgeon: Olga Millers, MD;  Location: [facility];  Service: Gynecology;  Laterality: N/A;  . BILATERAL SALPINGECTOMY Bilateral 05/26/2018   Procedure: BILATERAL SALPINGECTOMY;  Surgeon: Olga Millers, MD;  Location: [facility];  Service: Gynecology;  Laterality: Bilateral;  . [place], 2004   Social History   Socioeconomic History  . Marital status: Married    Spouse name: Not on file  . Number of children: 2  . Years of education: Not on file  . Highest education level: Not on file  Occupational History  . Not on file  Tobacco Use  . Smoking status: Never Smoker  . Smokeless tobacco: Never Used  Vaping Use  . Vaping Use: Never used  Substance and Sexual Activity  . Alcohol use: No  . Drug use: No  . Sexual activity: Not on file  Other Topics Concern  . Not on file  Social History Narrative  . Not on file   Social Determinants of Health    Financial Resource Strain: Not on file  Food Insecurity: Not on file  Transportation Needs: Not on file  Physical Activity: Not on file  Stress: Not on file  Social Connections: Not on file   Family History  Problem Relation Age of Onset  . Diabetes Mother   . Hypertension Father   . Diabetes Father   . Thyroid disease Sister    Allergies  Allergen Reactions  . Dust Mite Mixed Allergen Ext [Mite (D. Farinae)]   . Morphine And Related Itching  . Latex Rash and Other (See Comments)    Irritation (latex condom=infection)   Prior to Admission medications   Medication Sig Start Date End Date Taking? Authorizing Provider  albuterol (PROVENTIL HFA;VENTOLIN HFA) 108 (90 Base) MCG/ACT inhaler Inhale 1-2 puffs into the lungs every 6 (six) hours as needed for wheezing or shortness of breath.    [provider]  Cholecalciferol 1.25 MG (50000 UT) capsule cholecalciferol (vitamin D3) 1,250 mcg (50,000 unit) capsule  TK 1 C PO Q WEEK    [provider]  cyanocobalamin (,VITAMIN B-12,) 1000 MCG/ML injection cyanocobalamin (vit B-12) 1,000 mcg/mL injection solution  INJECT 1 ML Q MONTH    [provider]  furosemide (LASIX) 20 MG tablet Take 1 tablet (20 mg total) by mouth daily as needed. 08/28/20 11/26/20  Patwardhan, Reynold Bowen, MD  LORazepam (ATIVAN) 0.5 MG tablet Take  1 tablet by mouth as needed.    [provider]  pantoprazole (PROTONIX) 40 MG tablet Take 40 mg by mouth every morning. 07/04/20   [provider]  sucralfate (CARAFATE) 1 g tablet Take 1 g by mouth 2 (two) times daily. 07/03/20   [provider]     Positive ROS: Otherwise negative  All other systems have been reviewed and were otherwise negative with the exception of those mentioned in the HPI and as above.  Physical Exam: Constitutional: Alert, well-appearing, no acute distress Ears: External ears without lesions or tenderness. Ear canals are clear bilaterally with  intact, clear TMs.  Nasal: External nose without lesions. Septum is midline with moderate rhinitis with swollen mucous membranes and clear mucus discharge.  She has swollen inferior turbinates bilaterally..  Middle meatus regions are clear with no obvious mucopurulent discharge.  No polyps noted. Oral: Lips and gums without lesions. Tongue and palate mucosa without lesions. Posterior oropharynx clear.  Tonsil regions are small and benign bilaterally Fiberoptic laryngoscopy was performed through the right nostril.  The nasopharynx was clear.  The base of tongue vallecula and epiglottis were normal.  Vocal cords were clear with normal vocal cord mobility bilaterally.  She had moderate arytenoid mucosal edema but no erythema.  No mucosal lesions noted.  The fiberoptic laryngoscope was passed through the upper esophageal sphincter without any difficulty and the upper cervical esophagus was clear. Neck: No palpable adenopathy or masses.  No significant palpable thyroid nodules noted. Respiratory: Breathing comfortably  Skin: No facial/neck lesions or rash noted.  Laryngoscopy  Date/Time: 09/09/2020 3:48 PM Performed by: Rozetta Nunnery, MD Authorized by: Rozetta Nunnery, MD   Consent:    Consent obtained:  Verbal   Consent given by:  Patient Procedure details:    Indications: hoarseness, dysphagia, or aspiration     Medication:  Afrin   Instrument: flexible fiberoptic laryngoscope     Scope location: right nare   Sinus:    Right nasopharynx: normal   Mouth:    Oropharynx: normal     Base of tongue: normal     Epiglottis: normal   Throat:    Pyriform sinus: normal     True vocal cords: normal   Comments:     On fiberoptic laryngoscope the hypopharynx and larynx were clear.  She had mild edema of the arytenoid mucosa but no mucosal lesions noted.  Vocal cords were clear bilaterally with normal vocal mobility.    Assessment: Chronic rhinitis. Symptoms of laryngeal pharyngeal  reflux.  Normal hypopharynx and larynx on fiberoptic laryngoscopy.  Plan: Recommended changing her dose of pantoprazole from first thing in the morning to before dinner as this will provide better nighttime coverage when I suspect she is having more reflux symptoms. Also recommended regular use of Flonase 2 sprays each nostril at night as this will help some with nasal congestion as well as postnasal drainage. Reassured her of normal hypopharyngeal and laryngeal examination.  Would attempt to stop chronic throat clearing by either sucking on candy such as peppermint or drinking water instead of clearing the throat.  Radene Journey, MD

## 2021-03-31 IMAGING — RF DG ESOPHAGUS
5 series · 14 of 19 positions shown · non-contrast
Comparison: None.

CLINICAL DATA: Oropharyngeal dysphagia and esophageal dysphasia.

EXAM:
ESOPHOGRAM / BARIUM SWALLOW / BARIUM TABLET STUDY
TECHNIQUE: Combined double contrast and single contrast examination performed
using effervescent crystals, thick barium liquid, and thin barium
liquid. The patient was observed with fluoroscopy swallowing a 13 mm
barium sulphate tablet.
FLUOROSCOPY TIME:  Fluoroscopy Time:  48 seconds
Radiation Exposure Index (if provided by the fluoroscopic device):
50 mGy
Number of Acquired Spot Images: 0

[Series 1: one shot · 2 of 3 slices shown]
[im 1/3]
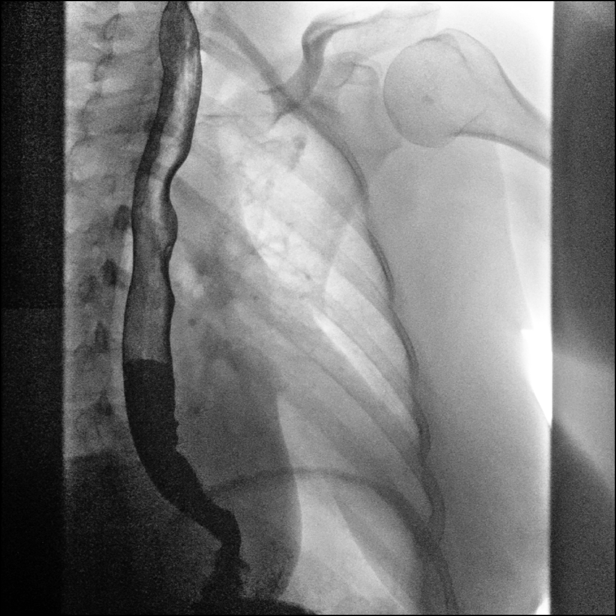
[im 3/3]
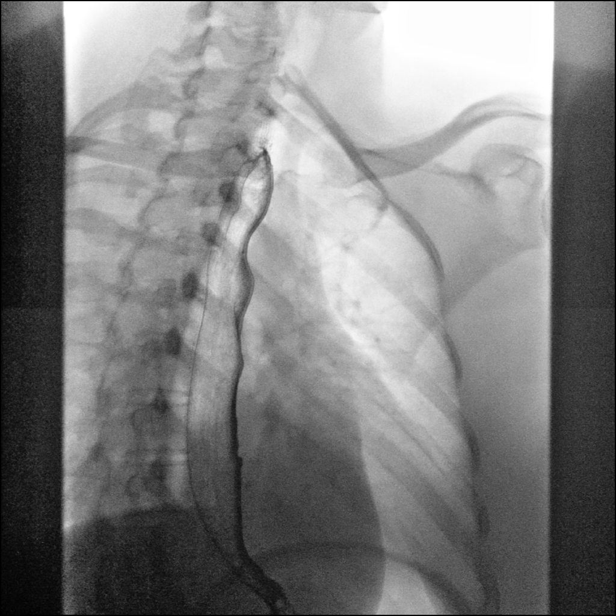

[Series 2: sequence · 3 of 36 frames shown (1 of 4)]
[frame 6/36]
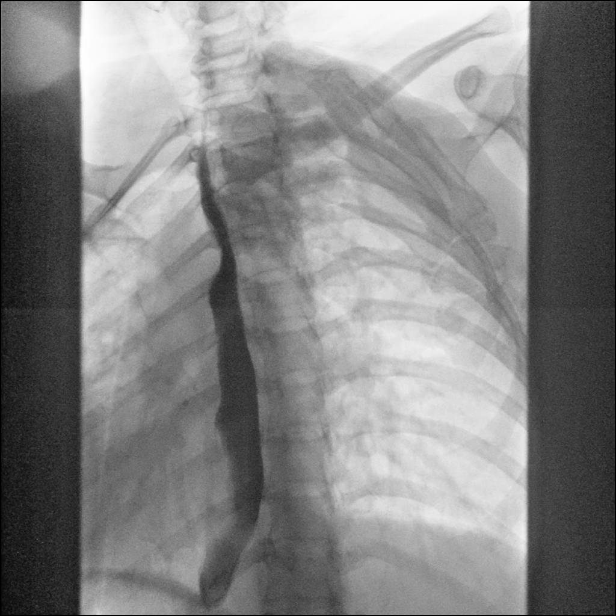
[frame 12/36]
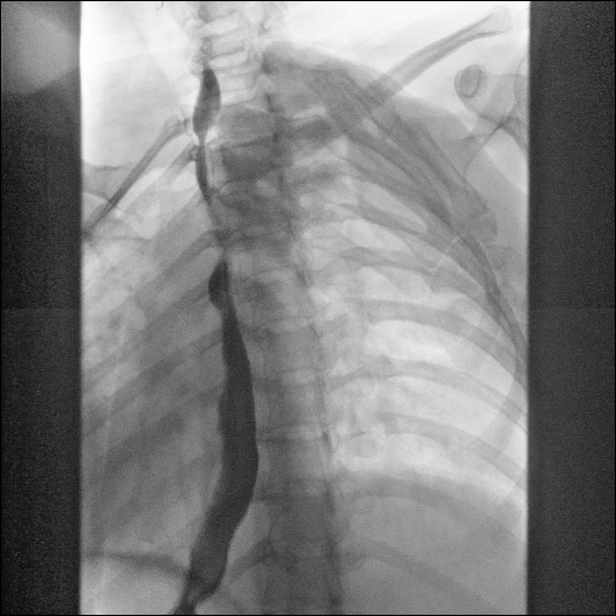
[frame 31/36]
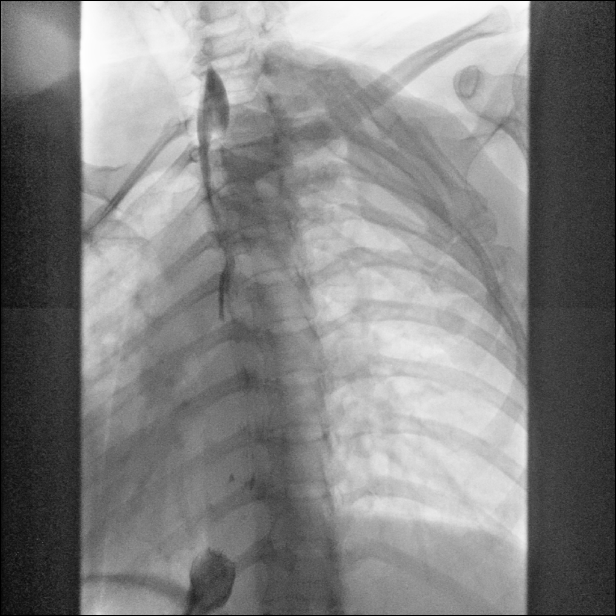

[Series 3: sequence · 3 of 6 frames shown (2 of 4)]
[frame 1/6]
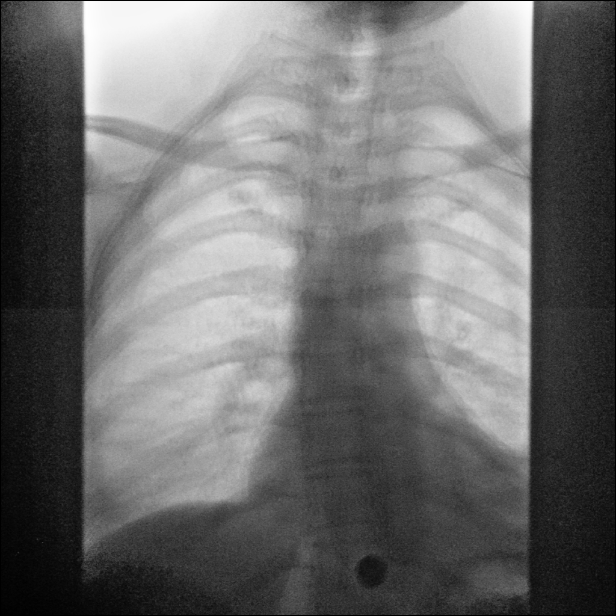
[frame 3/6]
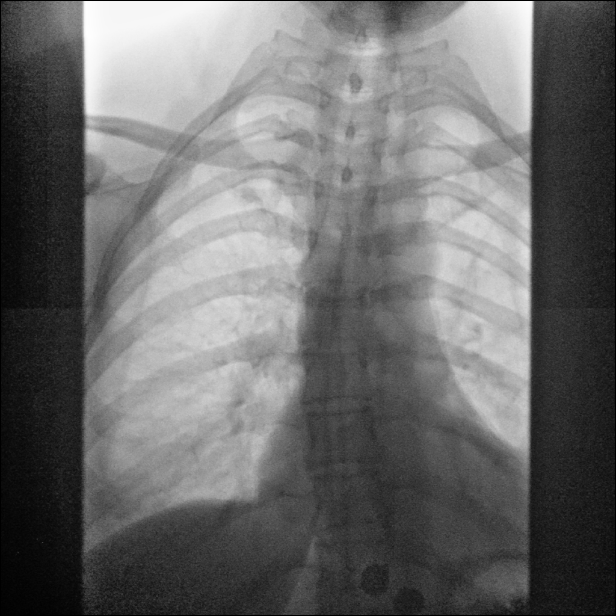
[frame 6/6]
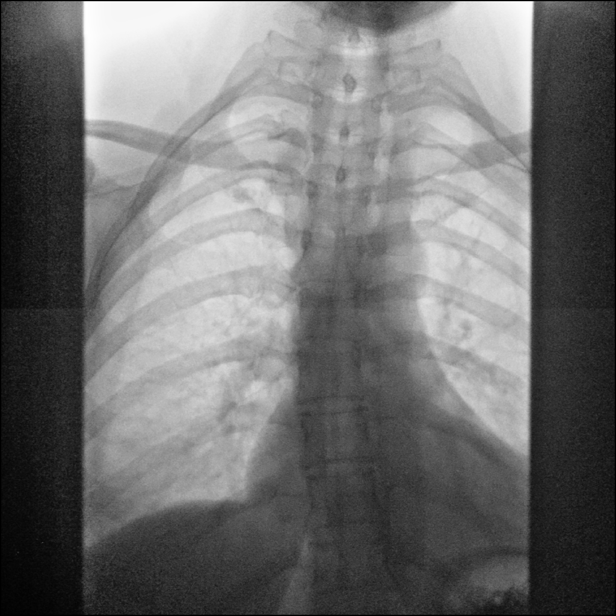

[Series 4: sequence · 3 of 39 frames shown (3 of 4)]
[frame 6/39]
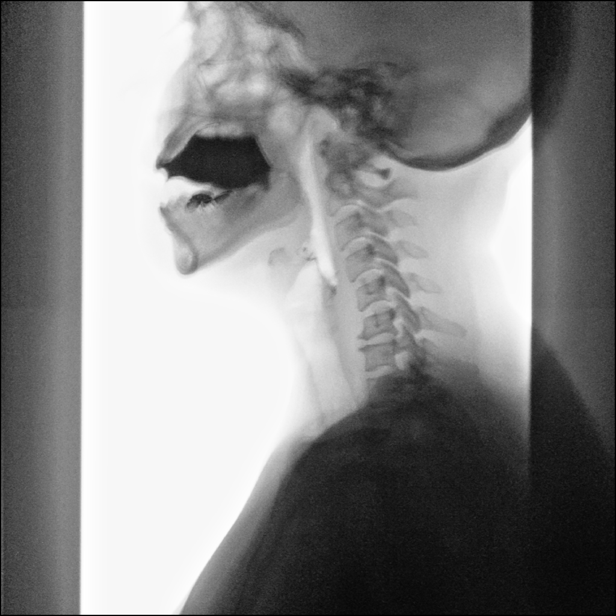
[frame 20/39]
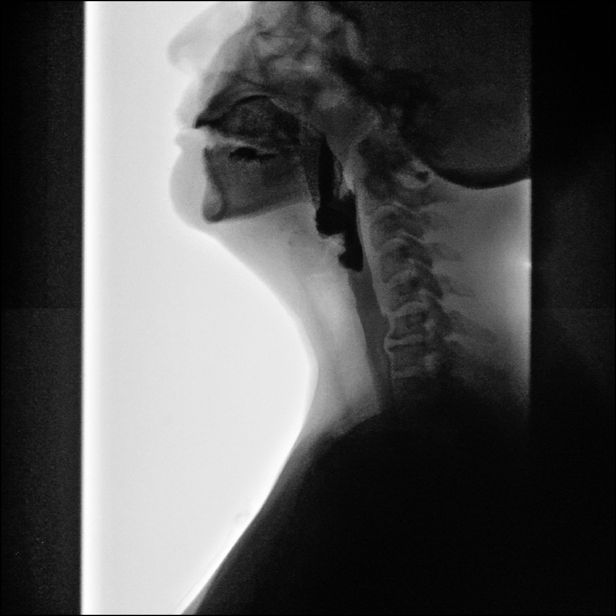
[frame 35/39]
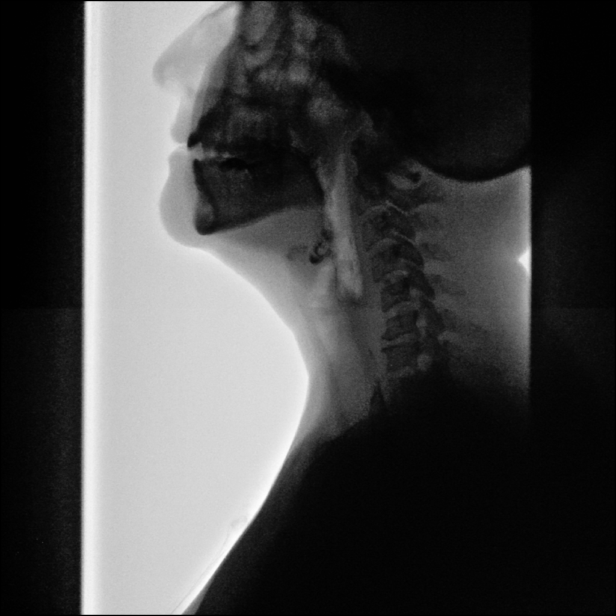

[Series 5: sequence · 3 of 30 frames shown (4 of 4)]
[frame 3/30]
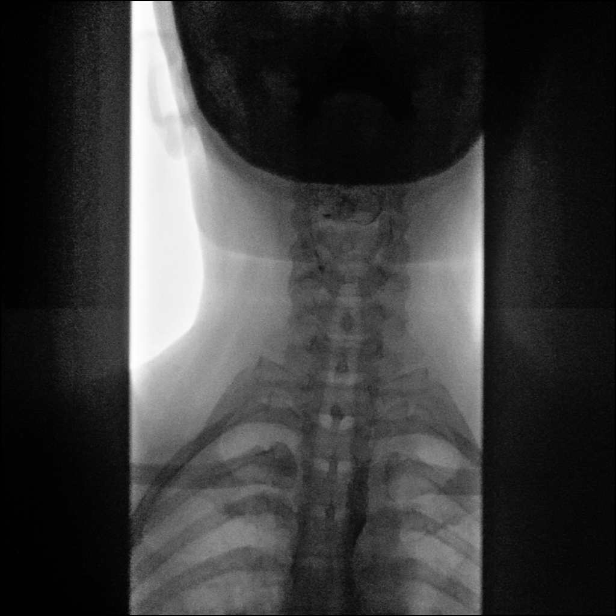
[frame 5/30]
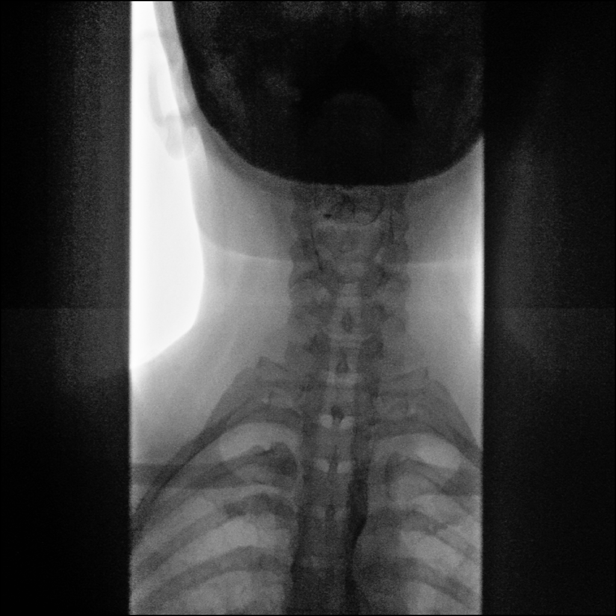
[frame 26/30]
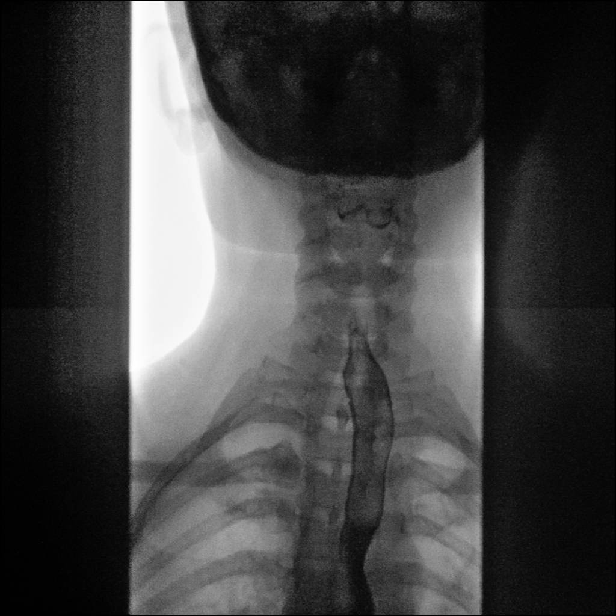

[14 of 19 positions shown; findings below may reference images not displayed]

FINDINGS: The swallowing mechanism is normal.  No aspiration identified.

The esophagus appears patent. No stricture or mass noted. Motility
of the esophagus appears normal. A 13 mm barium tablet was swallowed
and easily passed through the esophagus and into the stomach.

No hiatal hernia or reflux identified.
IMPRESSION: Normal exam.

## 2021-11-21 ENCOUNTER — Other Ambulatory Visit: Payer: Self-pay | Admitting: Cardiology

## 2022-02-23 ENCOUNTER — Other Ambulatory Visit: Payer: Self-pay | Admitting: Cardiology

## 2022-10-07 ENCOUNTER — Other Ambulatory Visit: Payer: Self-pay | Admitting: Gastroenterology

## 2022-10-07 DIAGNOSIS — K6289 Other specified diseases of anus and rectum: Secondary | ICD-10-CM

## 2022-10-09 ENCOUNTER — Ambulatory Visit
Admission: RE | Admit: 2022-10-09 | Discharge: 2022-10-09 | Disposition: A | Discharge status: Home / Self Care | Payer: BC Managed Care – PPO | Source: Ambulatory Visit | Attending: Gastroenterology | Admitting: Gastroenterology

## 2022-10-09 DIAGNOSIS — K6289 Other specified diseases of anus and rectum: Secondary | ICD-10-CM

## 2022-10-09 MED ORDER — GADOPICLENOL 0.5 MMOL/ML IV SOLN
7.0000 mL | Freq: Once | INTRAVENOUS | Status: AC | PRN
  Administered 2022-10-09: 7 mL via INTRAVENOUS

## 2022-12-07 ENCOUNTER — Other Ambulatory Visit: Payer: Self-pay | Admitting: Internal Medicine

## 2022-12-07 DIAGNOSIS — E039 Hypothyroidism, unspecified: Secondary | ICD-10-CM

## 2022-12-07 DIAGNOSIS — J392 Other diseases of pharynx: Secondary | ICD-10-CM

## 2023-01-08 ENCOUNTER — Other Ambulatory Visit: Payer: BC Managed Care – PPO

## 2023-02-01 ENCOUNTER — Other Ambulatory Visit: Payer: BC Managed Care – PPO

## 2023-02-18 ENCOUNTER — Other Ambulatory Visit: Payer: BC Managed Care – PPO

## 2023-02-23 ENCOUNTER — Ambulatory Visit
Admission: RE | Admit: 2023-02-23 | Discharge: 2023-02-23 | Disposition: A | Discharge status: Home / Self Care | Payer: BC Managed Care – PPO | Source: Ambulatory Visit | Attending: Internal Medicine | Admitting: Internal Medicine

## 2023-02-23 DIAGNOSIS — E039 Hypothyroidism, unspecified: Secondary | ICD-10-CM

## 2023-02-23 DIAGNOSIS — J392 Other diseases of pharynx: Secondary | ICD-10-CM

## 2024-10-16 ENCOUNTER — Ambulatory Visit: Admitting: Family Medicine

## 2024-10-17 ENCOUNTER — Ambulatory Visit: Admitting: Family Medicine
